# Patient Record
Sex: Male | Born: 1975 | Race: White | Hispanic: No | Marital: Married | Smoking: Current every day smoker
Health system: Southern US, Community
[De-identification: ages and names within clinical notes are randomized; demographics above are authoritative.]

## PROBLEM LIST (undated history)

## (undated) ENCOUNTER — Telehealth

## (undated) ENCOUNTER — Ambulatory Visit

## (undated) ENCOUNTER — Encounter: Attending: Adult Health | Primary: Adult Health

## (undated) ENCOUNTER — Encounter

## (undated) ENCOUNTER — Ambulatory Visit: Attending: Adult Health | Primary: Adult Health

## (undated) ENCOUNTER — Encounter: Payer: MEDICAID | Attending: Adult Health | Primary: Adult Health

## (undated) DIAGNOSIS — E1140 Type 2 diabetes mellitus with diabetic neuropathy, unspecified: Secondary | ICD-10-CM

## (undated) DIAGNOSIS — G8929 Other chronic pain: Secondary | ICD-10-CM

## (undated) DIAGNOSIS — E7800 Pure hypercholesterolemia, unspecified: Secondary | ICD-10-CM

## (undated) DIAGNOSIS — I10 Essential (primary) hypertension: Secondary | ICD-10-CM

## (undated) DIAGNOSIS — E119 Type 2 diabetes mellitus without complications: Secondary | ICD-10-CM

## (undated) DIAGNOSIS — I2510 Atherosclerotic heart disease of native coronary artery without angina pectoris: Secondary | ICD-10-CM

---

## 2009-02-07 ENCOUNTER — Ambulatory Visit: Payer: Self-pay | Admitting: Internal Medicine

## 2010-09-08 ENCOUNTER — Emergency Department: Payer: Self-pay | Admitting: Emergency Medicine

## 2010-11-08 ENCOUNTER — Ambulatory Visit: Payer: Self-pay | Admitting: Gastroenterology

## 2011-01-29 ENCOUNTER — Ambulatory Visit: Payer: Self-pay | Admitting: Internal Medicine

## 2011-04-13 ENCOUNTER — Inpatient Hospital Stay: Payer: Self-pay | Admitting: Specialist

## 2011-07-14 ENCOUNTER — Ambulatory Visit: Payer: Self-pay

## 2012-01-31 ENCOUNTER — Emergency Department: Payer: Self-pay | Admitting: *Deleted

## 2012-02-17 ENCOUNTER — Ambulatory Visit: Payer: Self-pay | Admitting: Sports Medicine

## 2012-02-19 ENCOUNTER — Other Ambulatory Visit: Payer: Self-pay | Admitting: Cardiology

## 2012-02-19 LAB — CK: CK, Total: 99 U/L (ref 35–232)

## 2012-02-19 LAB — TROPONIN I: Troponin-I: <0.02 ng/mL

## 2012-07-19 ENCOUNTER — Emergency Department: Payer: Self-pay | Admitting: Emergency Medicine

## 2012-07-19 LAB — COMPREHENSIVE METABOLIC PANEL
Alkaline Phosphatase: 66 U/L (ref 50–136)
Anion Gap: 10 (ref 7–16)
Bilirubin,Total: 0.5 mg/dL (ref 0.2–1.0)
Calcium, Total: 9.1 mg/dL (ref 8.5–10.1)
Chloride: 102 mmol/L (ref 98–107)
Co2: 23 mmol/L (ref 21–32)
Creatinine: 0.65 mg/dL (ref 0.60–1.30)
EGFR (African American): >60
Glucose: 213 mg/dL — ABNORMAL HIGH (ref 65–99)
SGOT(AST): 26 U/L (ref 15–37)
SGPT (ALT): 55 U/L (ref 12–78)

## 2012-07-19 LAB — CK TOTAL AND CKMB (NOT AT ARMC)
CK, Total: 126 U/L (ref 35–232)
CK-MB: <0.5 ng/mL — ABNORMAL LOW (ref 0.5–3.6)

## 2012-07-19 LAB — CBC
HCT: 40.9 % (ref 40.0–52.0)
HGB: 14.2 g/dL (ref 13.0–18.0)
MCH: 30.8 pg (ref 26.0–34.0)
MCV: 89 fL (ref 80–100)
RBC: 4.62 10*6/uL (ref 4.40–5.90)
RDW: 13 % (ref 11.5–14.5)

## 2012-08-03 ENCOUNTER — Ambulatory Visit: Payer: Self-pay | Admitting: Cardiology

## 2012-08-04 ENCOUNTER — Ambulatory Visit: Payer: Self-pay | Admitting: Family Medicine

## 2012-08-08 IMAGING — CR DG SHOULDER 3+V*R*
1 series · 3 of 3 positions shown · non-contrast
Comparison: none

REASON FOR EXAM: R shoulder pain due to fall
COMMENTS:

PROCEDURE:     MDR - MDR SHOULDER RIGHT COMPLETE  - July 14, 2011  [DATE]
RESULT:     A images of the right shoulder demonstrate no definite fracture,
dislocation or radiopaque foreign body.

[Series 1: view not recorded · 0.17mm/px · 3 of 3 slices shown]
[im 1/3]
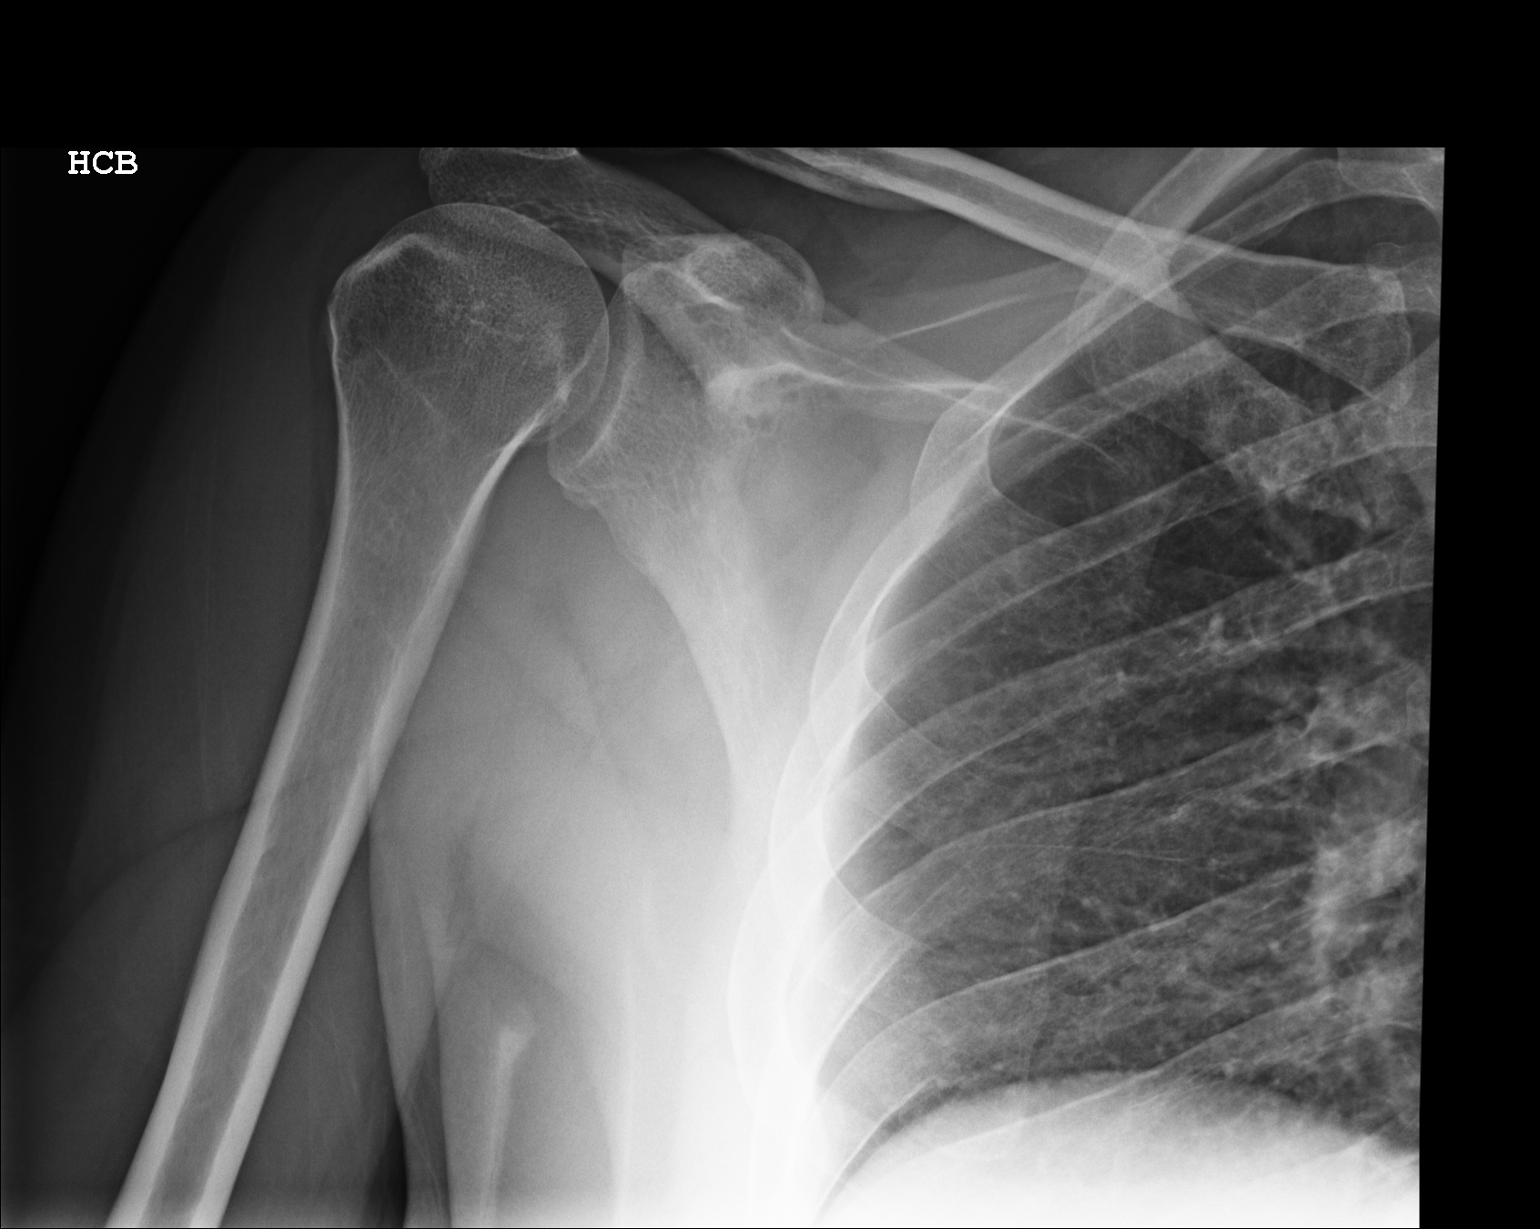
[im 2/3]
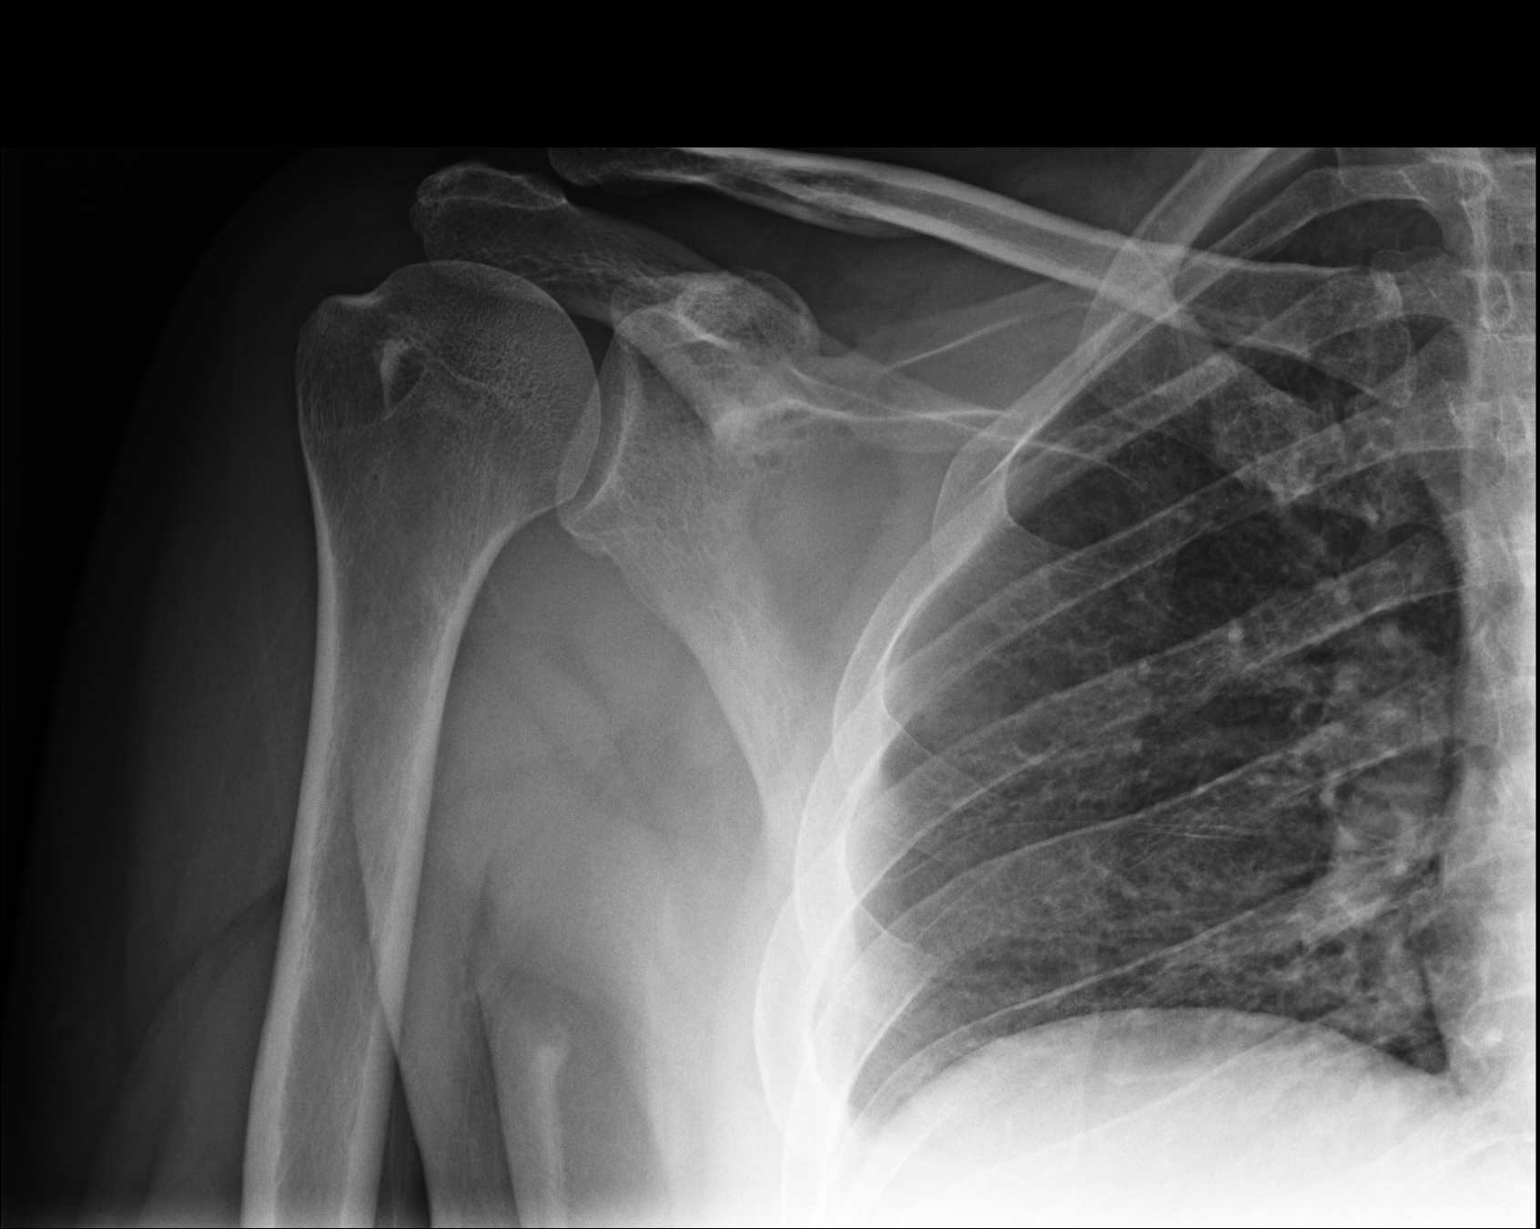
[im 3/3]
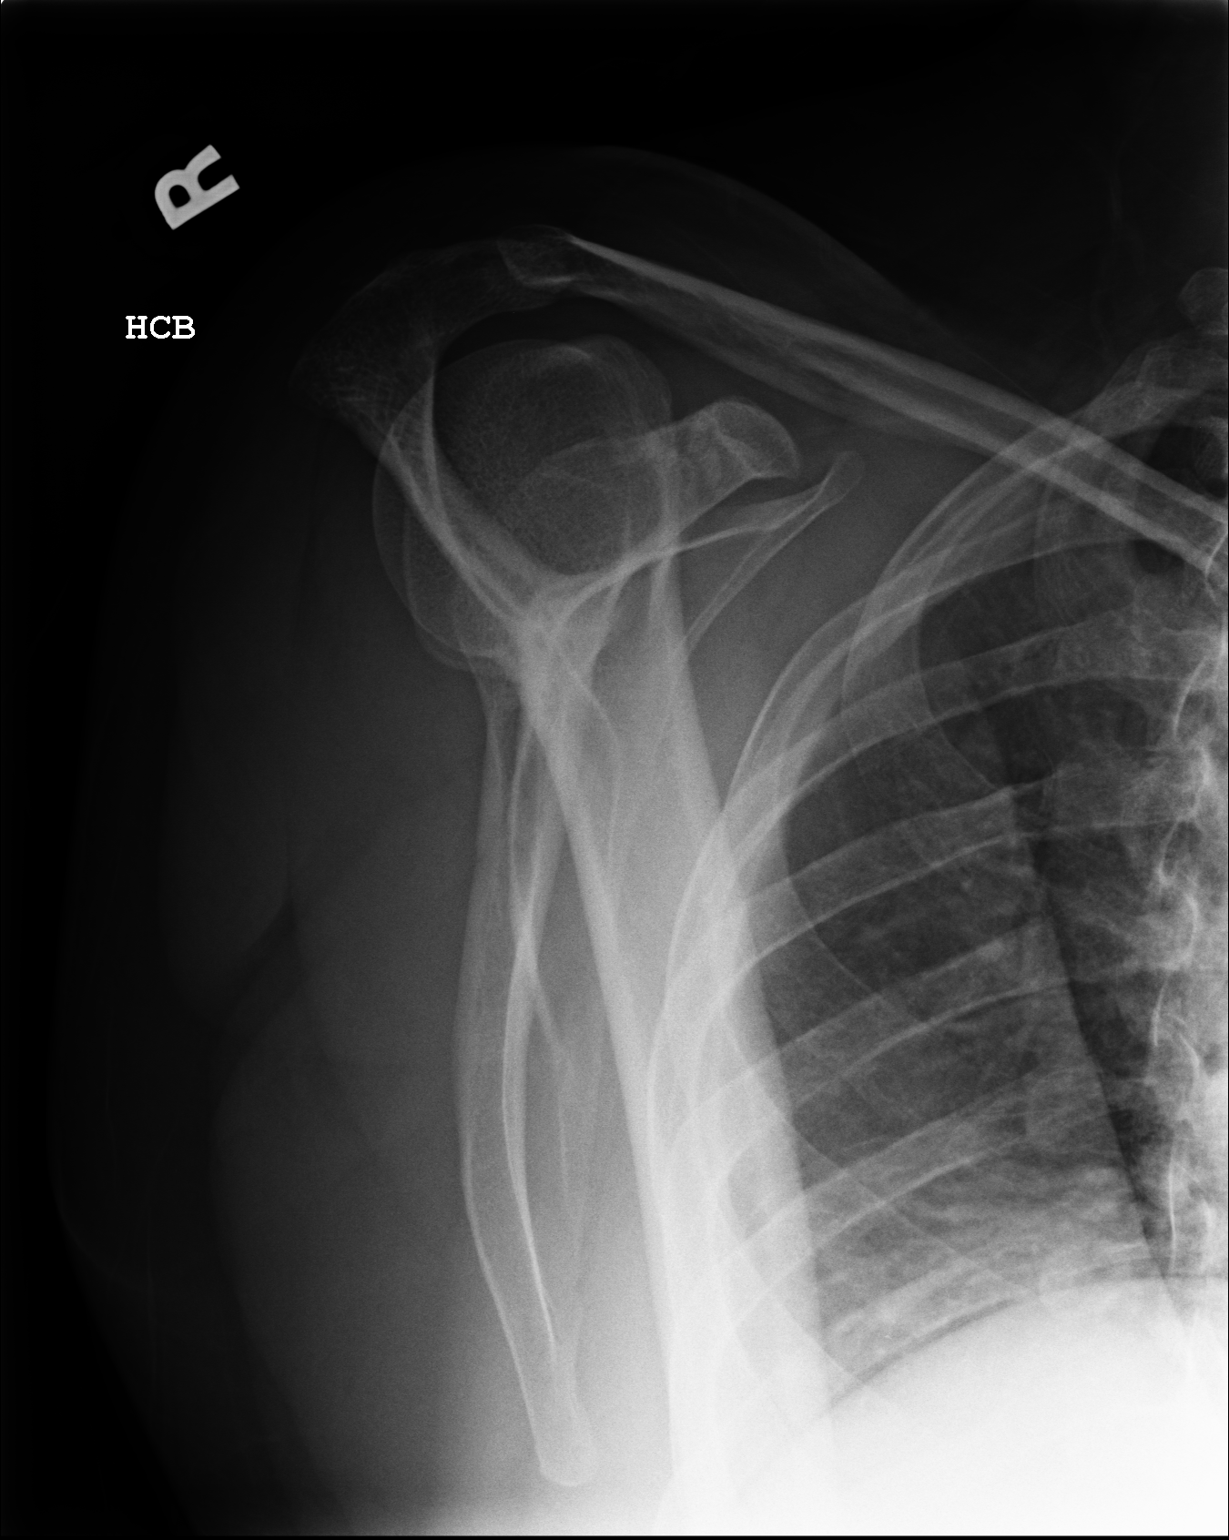

[3 of 3 positions shown; findings below may reference images not displayed]

IMPRESSION: Please see above.

## 2013-08-26 ENCOUNTER — Ambulatory Visit: Payer: Self-pay | Admitting: Family Medicine

## 2013-08-26 LAB — CREATININE, SERUM
EGFR (African American): >60
EGFR (Non-African Amer.): >60

## 2013-08-30 IMAGING — US ABDOMEN ULTRASOUND
1 series · 14 of 25 positions shown · non-contrast
Comparison: none

REASON FOR EXAM: epigastric pain  complete attn gallbladder pancreas
COMMENTS:

PROCEDURE:     [facility] - FEDH ABDOMEN GENERAL SURVEY  - August 04, 2012  [DATE]
RESULT:     Comparison: None
TECHNIQUE: Multiple gray-scale and color-flow Doppler images of the abdomen
are presented for review.

[Series 1: abdomen ultrasound · 0.31mm/px · 14 of 117 slices shown]
[im 1/117]
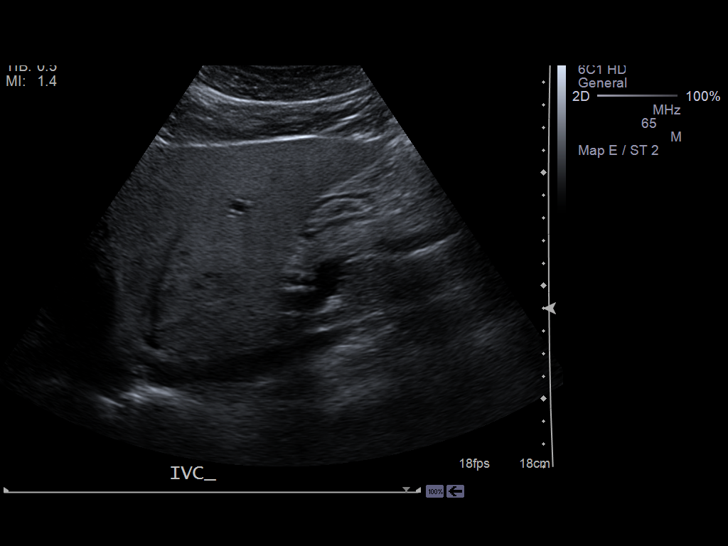
[im 10/117]
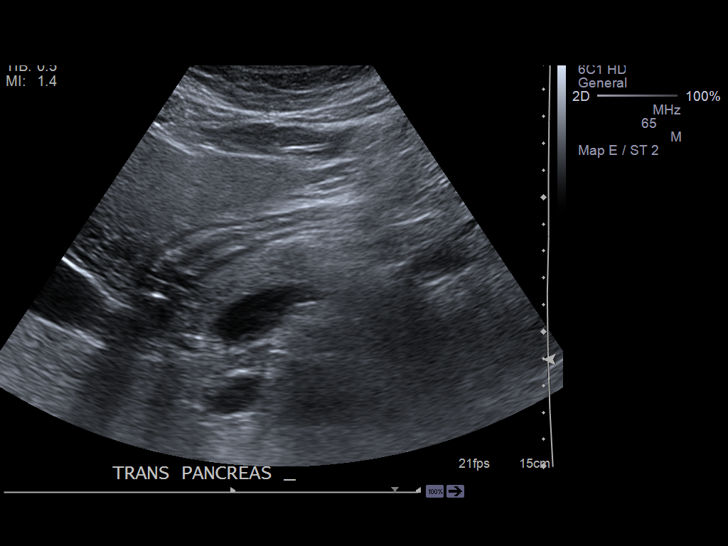
[im 20/117]
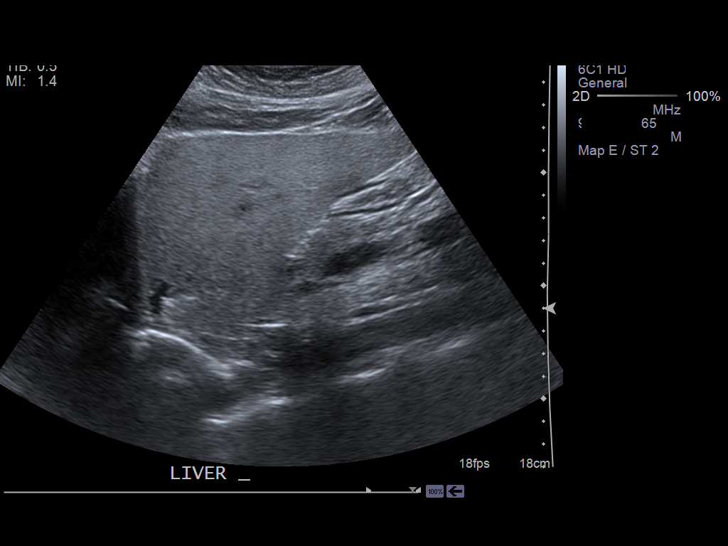
[im 30/117]
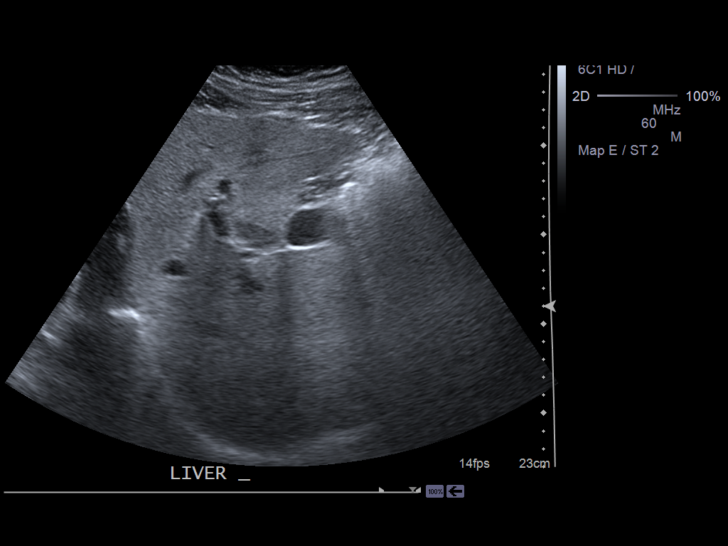
[im 39/117]
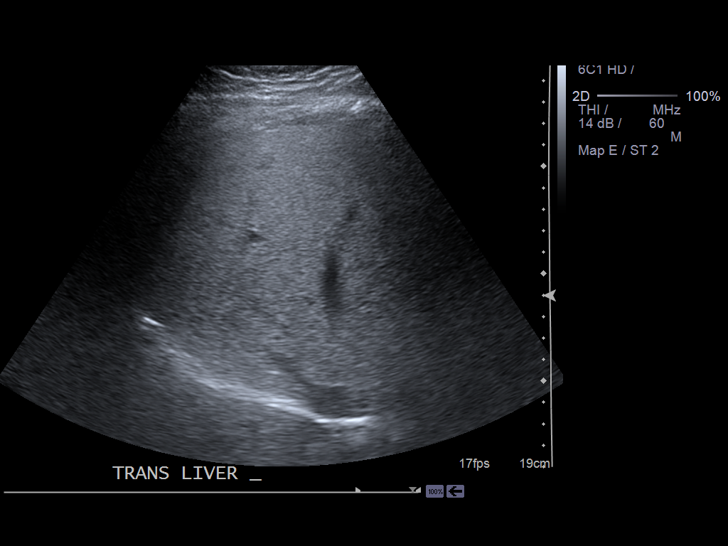
[im 44/117]
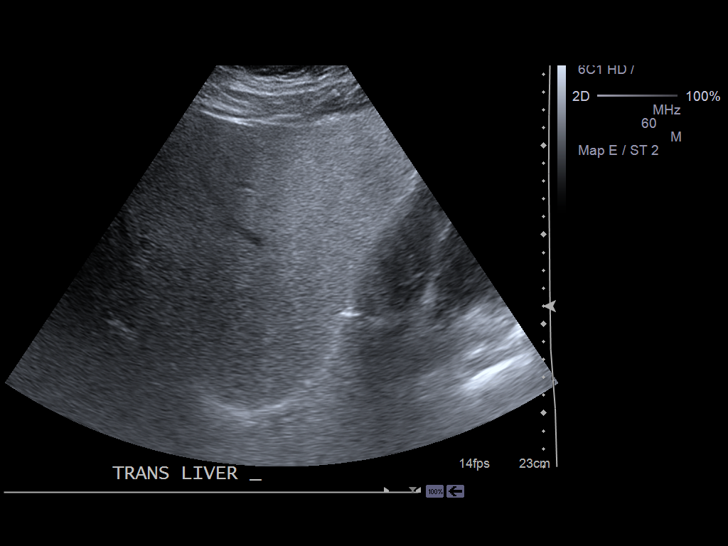
[im 54/117]
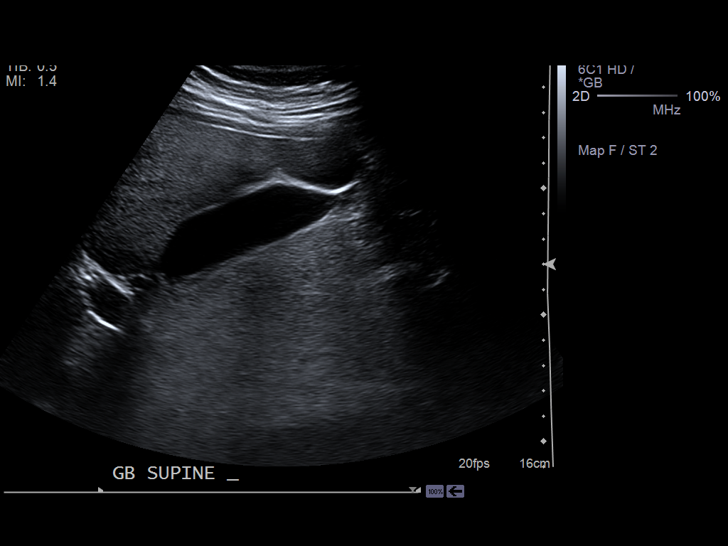
[im 63/117]
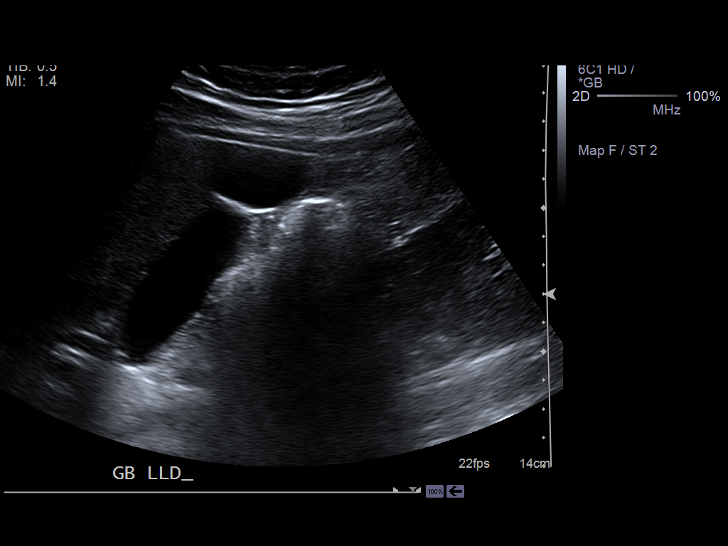
[im 73/117]
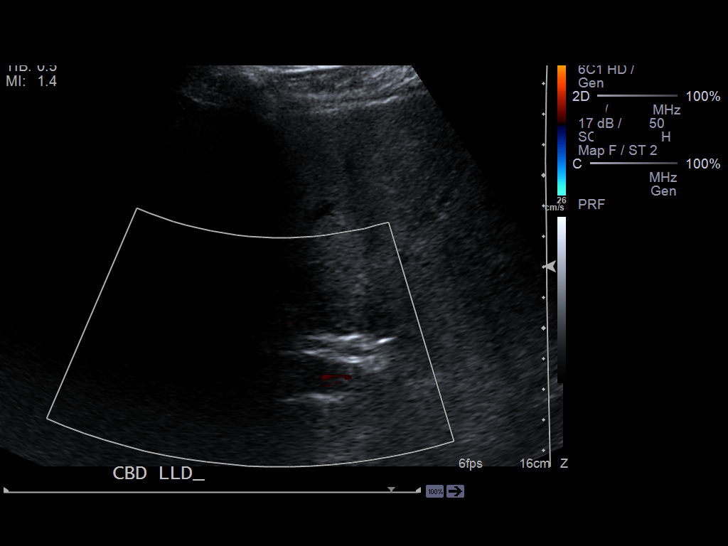
[im 78/117]
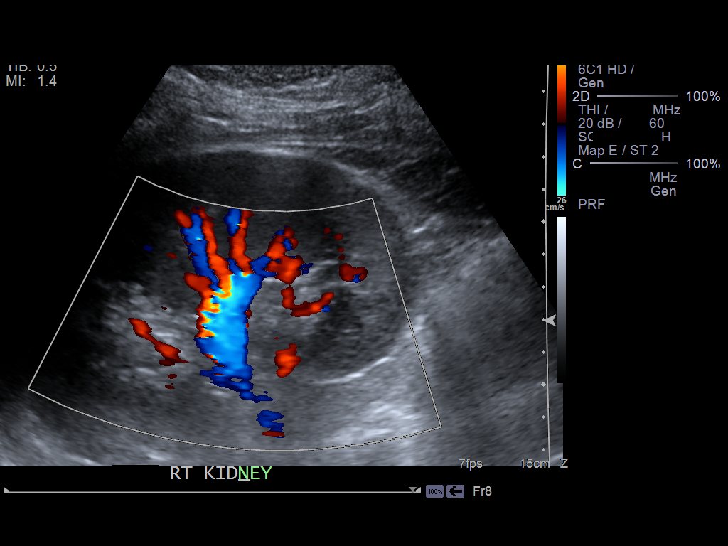
[im 88/117]
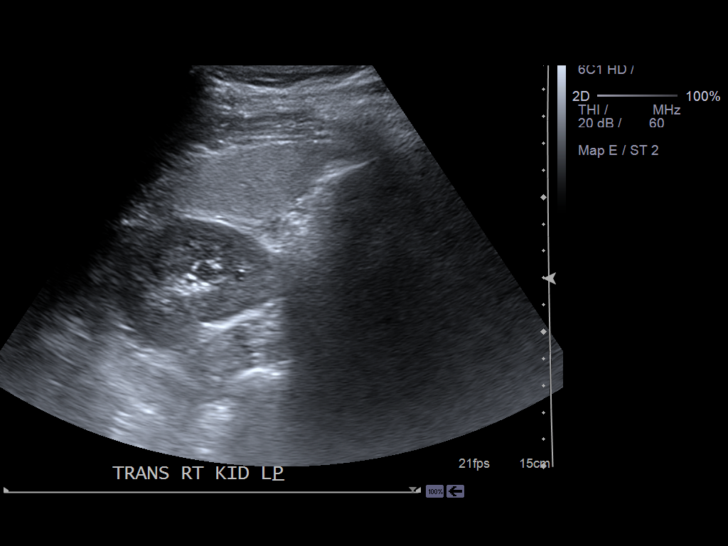
[im 97/117]
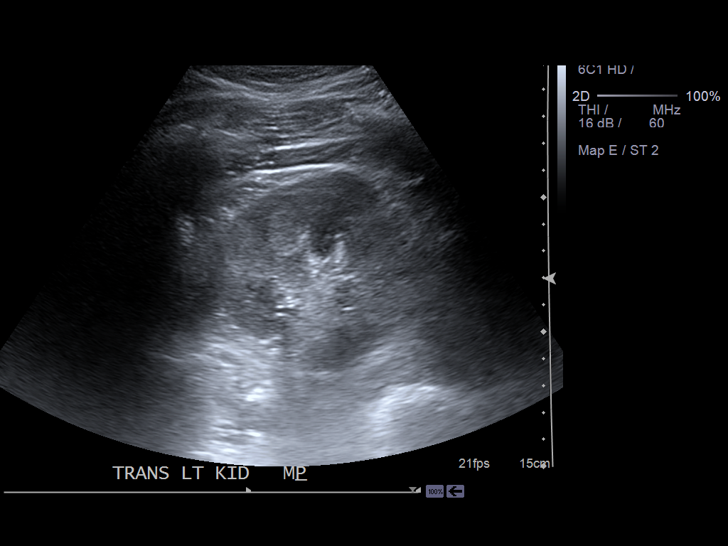
[im 107/117]
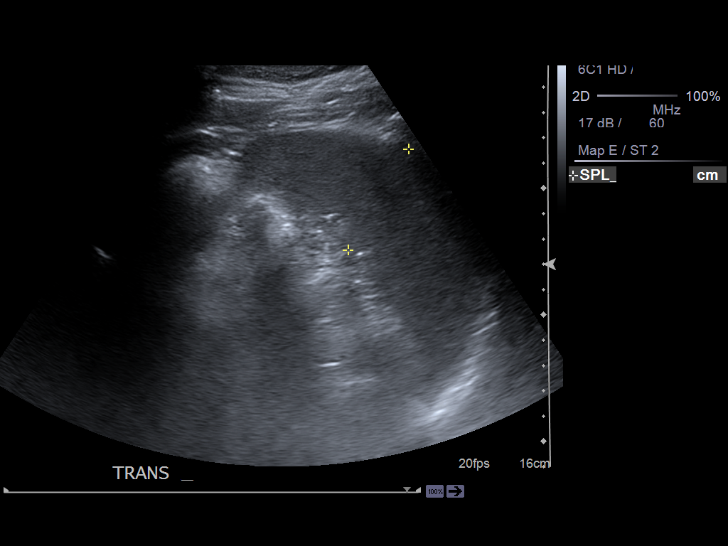
[im 117/117]
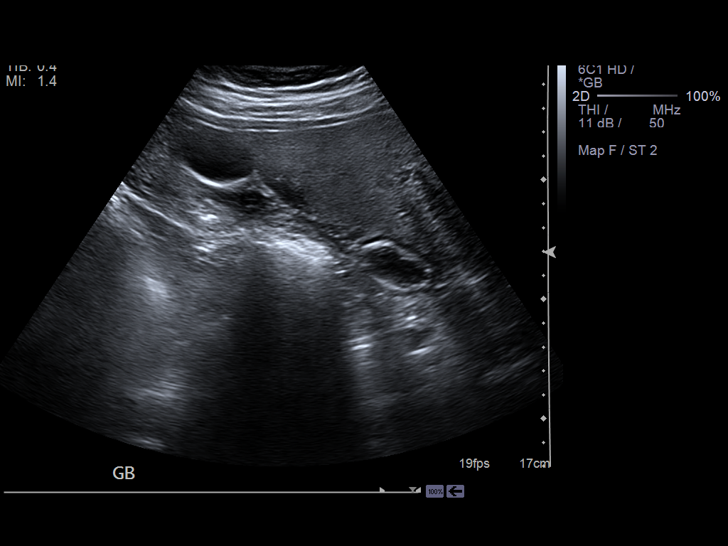

[14 of 25 positions shown; findings below may reference images not displayed]

FINDINGS: The liver is increased in echogenicity as can be seen with hepatic
steatosis. The liver is without evidence of a focal hepatic lesion.

There is no cholelithiasis or biliary sludge. There is no intra- or
extrahepatic biliary ductal dilatation. The common duct measures 4.4 mm in
maximal diameter. There is no gallbladder wall thickening, pericholecystic
fluid, or sonographic Murphy's sign.

The visualized portion of the pancreas is normal in echogenicity. The spleen
is top normal in size measuring 12.5 mm without focal abnormality. Bilateral
kidneys are normal in echogenicity and size. The right kidney measures
x 5.3 x 6 cm. The left kidney measures 12.9 x 6.4 x 7 cm. There are no renal
calculi or hydronephrosis. The abdominal aorta and IVC are unremarkable.
IMPRESSION: 1. Hepatic steatosis. Correlate with liver function tests.

[REDACTED]

## 2013-12-12 ENCOUNTER — Ambulatory Visit: Payer: Self-pay | Admitting: Neurology

## 2013-12-14 ENCOUNTER — Ambulatory Visit: Payer: Self-pay | Admitting: Neurology

## 2013-12-14 LAB — COMPREHENSIVE METABOLIC PANEL
ALK PHOS: 70 U/L
ANION GAP: 3 — AB (ref 7–16)
AST: 22 U/L (ref 15–37)
Albumin: 3.9 g/dL (ref 3.4–5.0)
BILIRUBIN TOTAL: 0.4 mg/dL (ref 0.2–1.0)
BUN: 10 mg/dL (ref 7–18)
CO2: 29 mmol/L (ref 21–32)
Calcium, Total: 8.7 mg/dL (ref 8.5–10.1)
Chloride: 99 mmol/L (ref 98–107)
Creatinine: 0.78 mg/dL (ref 0.60–1.30)
EGFR (African American): >60
EGFR (Non-African Amer.): >60
GLUCOSE: 253 mg/dL — AB (ref 65–99)
Osmolality: 270 (ref 275–301)
POTASSIUM: 4 mmol/L (ref 3.5–5.1)
SGPT (ALT): 65 U/L (ref 12–78)
SODIUM: 131 mmol/L — AB (ref 136–145)
Total Protein: 7.4 g/dL (ref 6.4–8.2)

## 2013-12-14 LAB — CSF CELL COUNT WITH DIFFERENTIAL
CSF Tube #: 1
Eosinophil: 0 %
Lymphocytes: 0 %
MONOCYTES/MACROPHAGES: 0 %
NEUTROS PCT: 0 %
Other Cells: 0 %
RBC (CSF): 97 /mm3
WBC (CSF): 0 /mm3

## 2013-12-14 LAB — CBC WITH DIFFERENTIAL/PLATELET
BASOS ABS: 0.1 10*3/uL (ref 0.0–0.1)
Basophil %: 1.2 %
EOS ABS: 0.1 10*3/uL (ref 0.0–0.7)
EOS PCT: 1 %
HCT: 40.2 % (ref 40.0–52.0)
HGB: 13.9 g/dL (ref 13.0–18.0)
LYMPHS ABS: 2.5 10*3/uL (ref 1.0–3.6)
Lymphocyte %: 22.7 %
MCH: 30.5 pg (ref 26.0–34.0)
MCHC: 34.6 g/dL (ref 32.0–36.0)
MCV: 88 fL (ref 80–100)
Monocyte #: 0.9 x10 3/mm (ref 0.2–1.0)
Monocyte %: 7.9 %
NEUTROS ABS: 7.3 10*3/uL — AB (ref 1.4–6.5)
Neutrophil %: 67.2 %
Platelet: 266 10*3/uL (ref 150–440)
RBC: 4.57 10*6/uL (ref 4.40–5.90)
RDW: 13.4 % (ref 11.5–14.5)
WBC: 10.9 10*3/uL — ABNORMAL HIGH (ref 3.8–10.6)

## 2013-12-14 LAB — PROTIME-INR
INR: 0.9
PROTHROMBIN TIME: 12.5 s (ref 11.5–14.7)

## 2013-12-14 LAB — PROTEIN, CSF: Protein, CSF: 45 mg/dL (ref 15–45)

## 2013-12-14 LAB — GLUCOSE, CSF: Glucose, CSF: 131 mg/dL — ABNORMAL HIGH (ref 40–75)

## 2014-01-11 DIAGNOSIS — Z0289 Encounter for other administrative examinations: Secondary | ICD-10-CM

## 2014-08-22 ENCOUNTER — Ambulatory Visit: Payer: Self-pay | Admitting: Neurology

## 2014-09-13 ENCOUNTER — Ambulatory Visit: Payer: Self-pay | Admitting: Neurology

## 2015-04-14 ENCOUNTER — Encounter: Payer: Self-pay | Admitting: *Deleted

## 2015-04-14 ENCOUNTER — Emergency Department: Payer: BLUE CROSS/BLUE SHIELD

## 2015-04-14 ENCOUNTER — Emergency Department
Admission: EM | Admit: 2015-04-14 | Discharge: 2015-04-14 | Disposition: A | Discharge status: Home / Self Care | Payer: BLUE CROSS/BLUE SHIELD | Attending: Student | Admitting: Student

## 2015-04-14 ENCOUNTER — Other Ambulatory Visit: Payer: Self-pay

## 2015-04-14 DIAGNOSIS — Y939 Activity, unspecified: Secondary | ICD-10-CM | POA: Insufficient documentation

## 2015-04-14 DIAGNOSIS — Y929 Unspecified place or not applicable: Secondary | ICD-10-CM | POA: Diagnosis not present

## 2015-04-14 DIAGNOSIS — R251 Tremor, unspecified: Secondary | ICD-10-CM | POA: Insufficient documentation

## 2015-04-14 DIAGNOSIS — I10 Essential (primary) hypertension: Secondary | ICD-10-CM | POA: Insufficient documentation

## 2015-04-14 DIAGNOSIS — W57XXXA Bitten or stung by nonvenomous insect and other nonvenomous arthropods, initial encounter: Secondary | ICD-10-CM | POA: Insufficient documentation

## 2015-04-14 DIAGNOSIS — Y999 Unspecified external cause status: Secondary | ICD-10-CM | POA: Diagnosis not present

## 2015-04-14 DIAGNOSIS — R569 Unspecified convulsions: Secondary | ICD-10-CM | POA: Diagnosis not present

## 2015-04-14 DIAGNOSIS — Z720 Tobacco use: Secondary | ICD-10-CM | POA: Diagnosis not present

## 2015-04-14 DIAGNOSIS — T148 Other injury of unspecified body region: Secondary | ICD-10-CM | POA: Diagnosis not present

## 2015-04-14 HISTORY — DX: Type 2 diabetes mellitus with diabetic neuropathy, unspecified: E11.40

## 2015-04-14 HISTORY — DX: Pure hypercholesterolemia, unspecified: E78.00

## 2015-04-14 HISTORY — DX: Atherosclerotic heart disease of native coronary artery without angina pectoris: I25.10

## 2015-04-14 HISTORY — DX: Type 2 diabetes mellitus without complications: E11.9

## 2015-04-14 HISTORY — DX: Essential (primary) hypertension: I10

## 2015-04-14 HISTORY — DX: Other chronic pain: G89.29

## 2015-04-14 LAB — URINE DRUG SCREEN, QUALITATIVE (ARMC ONLY)
Amphetamines, Ur Screen: NOT DETECTED
Barbiturates, Ur Screen: NOT DETECTED
Benzodiazepine, Ur Scrn: POSITIVE — AB
COCAINE METABOLITE, UR ~~LOC~~: NOT DETECTED
Cannabinoid 50 Ng, Ur ~~LOC~~: NOT DETECTED
MDMA (Ecstasy)Ur Screen: NOT DETECTED
Methadone Scn, Ur: NOT DETECTED
OPIATE, UR SCREEN: NOT DETECTED
PHENCYCLIDINE (PCP) UR S: NOT DETECTED
Tricyclic, Ur Screen: NOT DETECTED

## 2015-04-14 LAB — SALICYLATE LEVEL: Salicylate Lvl: <4 mg/dL (ref 2.8–30.0)

## 2015-04-14 LAB — GLUCOSE, CAPILLARY: GLUCOSE-CAPILLARY: 100 mg/dL — AB (ref 65–99)

## 2015-04-14 LAB — URINALYSIS COMPLETE WITH MICROSCOPIC (ARMC ONLY)
BACTERIA UA: NONE SEEN
Bilirubin Urine: NEGATIVE
Hgb urine dipstick: NEGATIVE
Ketones, ur: NEGATIVE mg/dL
Leukocytes, UA: NEGATIVE
Nitrite: NEGATIVE
Protein, ur: NEGATIVE mg/dL
Specific Gravity, Urine: 1.006 (ref 1.005–1.030)
WBC UA: NONE SEEN WBC/hpf (ref 0–5)
pH: 7 (ref 5.0–8.0)

## 2015-04-14 LAB — COMPREHENSIVE METABOLIC PANEL
ALT: 31 U/L (ref 17–63)
ANION GAP: 11 (ref 5–15)
AST: 25 U/L (ref 15–41)
Albumin: 5 g/dL (ref 3.5–5.0)
Alkaline Phosphatase: 65 U/L (ref 38–126)
BUN: 11 mg/dL (ref 6–20)
CO2: 26 mmol/L (ref 22–32)
Calcium: 9.8 mg/dL (ref 8.9–10.3)
Chloride: 99 mmol/L — ABNORMAL LOW (ref 101–111)
Creatinine, Ser: 0.68 mg/dL (ref 0.61–1.24)
GFR calc Af Amer: >60 mL/min (ref >60)
GFR calc non Af Amer: >60 mL/min (ref >60)
Glucose, Bld: 101 mg/dL — ABNORMAL HIGH (ref 65–99)
Potassium: 4.3 mmol/L (ref 3.5–5.1)
Sodium: 136 mmol/L (ref 135–145)
TOTAL PROTEIN: 8.5 g/dL — AB (ref 6.5–8.1)
Total Bilirubin: 0.4 mg/dL (ref 0.3–1.2)

## 2015-04-14 LAB — CBC WITH DIFFERENTIAL/PLATELET
Basophils Absolute: 0.1 10*3/uL (ref 0–0.1)
Basophils Relative: 1 %
EOS ABS: 0.1 10*3/uL (ref 0–0.7)
EOS PCT: 1 %
HCT: 48.4 % (ref 40.0–52.0)
HEMOGLOBIN: 16.6 g/dL (ref 13.0–18.0)
LYMPHS ABS: 3 10*3/uL (ref 1.0–3.6)
Lymphocytes Relative: 21 %
MCH: 30.4 pg (ref 26.0–34.0)
MCHC: 34.4 g/dL (ref 32.0–36.0)
MCV: 88.5 fL (ref 80.0–100.0)
Monocytes Absolute: 0.8 10*3/uL (ref 0.2–1.0)
Monocytes Relative: 6 %
Neutro Abs: 10.2 10*3/uL — ABNORMAL HIGH (ref 1.4–6.5)
Neutrophils Relative %: 71 %
PLATELETS: 277 10*3/uL (ref 150–440)
RBC: 5.46 MIL/uL (ref 4.40–5.90)
RDW: 14.1 % (ref 11.5–14.5)
WBC: 14.2 10*3/uL — AB (ref 3.8–10.6)

## 2015-04-14 LAB — TROPONIN I: Troponin I: <0.03 ng/mL (ref <0.031)

## 2015-04-14 LAB — ACETAMINOPHEN LEVEL: Acetaminophen (Tylenol), Serum: <10 ug/mL — ABNORMAL LOW (ref 10–30)

## 2015-04-14 LAB — CK: CK TOTAL: 96 U/L (ref 49–397)

## 2015-04-14 LAB — ETHANOL: Alcohol, Ethyl (B): <5 mg/dL (ref <5)

## 2015-04-14 MED ORDER — SODIUM CHLORIDE 0.9 % IV SOLN
1000.0000 mL | Freq: Once | INTRAVENOUS | Status: AC
  Administered 2015-04-14: 1000 mL via INTRAVENOUS

## 2015-04-14 MED ORDER — SODIUM CHLORIDE 0.9 % IV SOLN
1000.0000 mg | Freq: Two times a day (BID) | INTRAVENOUS | Status: DC
  Administered 2015-04-14: 1000 mg via INTRAVENOUS
  Filled 2015-04-14 (×3): qty 10

## 2015-04-14 MED ORDER — DOXYCYCLINE HYCLATE 100 MG PO CAPS: 100.0000 mg | ORAL_CAPSULE | Freq: Two times a day (BID) | ORAL | Status: DC

## 2015-04-14 MED ORDER — ONDANSETRON HCL 4 MG/2ML IJ SOLN
4.0000 mg | Freq: Once | INTRAMUSCULAR | Status: AC
  Administered 2015-04-14: 4 mg via INTRAVENOUS

## 2015-04-14 MED ORDER — LORAZEPAM 2 MG/ML IJ SOLN
2.0000 mg | Freq: Once | INTRAMUSCULAR | Status: AC
  Administered 2015-04-14: 2 mg via INTRAVENOUS
  Filled 2015-04-14: qty 1

## 2015-04-14 MED ORDER — ONDANSETRON HCL 4 MG/2ML IJ SOLN
INTRAMUSCULAR | Status: AC
  Filled 2015-04-14: qty 2

## 2015-04-14 NOTE — ED Notes (Signed)
Pt began to shake back and forth in the bed arms and legs moving. Pt alert during episode and able to talk and make eye contact. Wife states the episodes are worse and similar to the ones he has been having at home for the past 2 weeks. Mayford Knife, MD brought to bedside new orders received.

## 2015-04-14 NOTE — ED Notes (Signed)
Pt did state that he was bit by a tick on his rt thumb. Pt states he was unsure how long it had been present.

## 2015-04-14 NOTE — ED Notes (Signed)
keppra ordered from pharmacy

## 2015-04-14 NOTE — ED Provider Notes (Signed)
[facility] Emergency Department Provider Note     Time seen: ----------------------------------------- 1:14 PM on 04/14/2015 -----------------------------------------    I have reviewed the triage vital signs and the nursing notes.   HISTORY  Chief Complaint Tremors    HPI Derek [name] is a 39 y.o. male who presents ER for tremor for the last at least a week and a half. Patient states he suffered for muscle tremors for 2 months. He states that since Saturday they're getting worse. He was seen by the [facility] was told he had possible serotonin syndrome. He was told to wean off one of his medications. Patient states she's taken 20 mg of Valium prior to arrival, has not changed his medications other than taking the extra Valium, has not run out of any medications. Patient has been alcohol free for the last 2 years. Smokes daily, denies illicit drug use.   Past Medical History  Diagnosis Date  . Diabetes mellitus without complication   . Chronic pain   . Coronary artery disease   . Diabetic neuropathy   . Hypertension   . Hypercholesteremia     There are no active problems to display for this patient.   No past surgical history on file.  Allergies Review of patient's allergies indicates no known allergies.  Social History History  Substance Use Topics  . Smoking status: Current Every Day Smoker  . Smokeless tobacco: Not on file  . Alcohol Use: No    Review of Systems Constitutional: Negative for fever. Eyes: Negative for visual changes. ENT: Negative for sore throat. Cardiovascular: Negative for chest pain. Respiratory: Negative for shortness of breath. Gastrointestinal: Negative for abdominal pain, vomiting and diarrhea. Genitourinary: Negative for dysuria. Musculoskeletal: Negative for back pain. Skin: Negative for rash. Neurological: Negative for headaches, positive for tremor  10-point ROS otherwise  negative.  ____________________________________________   PHYSICAL EXAM:  VITAL SIGNS: ED Triage Vitals  Enc Vitals Group     BP 04/14/15 1247 132/99 mmHg     Pulse Rate 04/14/15 1247 103     Resp 04/14/15 1247 22     Temp 04/14/15 1247 98.5 F (36.9 C)     Temp src --      SpO2 04/14/15 1247 98 %     Weight 04/14/15 1247 234 lb (106.142 kg)     Height 04/14/15 1247 6' (1.829 m)     Head Cir --      Peak Flow --      Pain Score 04/14/15 1250 6     Pain Loc --      Pain Edu? --      Excl. in GC? --     Constitutional: Alert and oriented. Well appearing and in no distress. Eyes: Conjunctivae are normal. PERRL. Normal extraocular movements. ENT   Head: Normocephalic and atraumatic.   Nose: No congestion/rhinnorhea.   Mouth/Throat: Mucous membranes are moist.   Neck: No stridor. Cardiovascular: Normal rate, regular rhythm. Normal and symmetric distal pulses are present in all extremities. No murmurs, rubs, or gallops. Respiratory: Normal respiratory effort without tachypnea nor retractions. Breath sounds are clear and equal bilaterally. No wheezes/rales/rhonchi. Gastrointestinal: Soft and nontender. No distention. No abdominal bruits.  Musculoskeletal: Nontender with normal range of motion in all extremities. No joint effusions.  No lower extremity tenderness nor edema. Neurologic:  Normal speech and language. No gross focal neurologic deficits are appreciated. Patient with tremor, no sensory deficit, shaking episodes. Skin:  Skin is warm, dry and  intact. No rash noted. Psychiatric: Mood and affect are normal. Speech and behavior are normal. Patient exhibits appropriate insight and judgment. ____________________________________________  ED COURSE:  Pertinent labs & imaging results that were available during my care of the patient were reviewed by me and considered in my medical decision making (see chart for details). Patient with nonspecific shaking, received IV  Ativan will check basic labs, toxin screen, head CT. Also possible tickborne etiology. ____________________________________________    LABS (pertinent positives/negatives)  Labs Reviewed  CBC WITH DIFFERENTIAL/PLATELET - Abnormal; Notable for the following:    WBC 14.2 (*)    Neutro Abs 10.2 (*)    All other components within normal limits  COMPREHENSIVE METABOLIC PANEL - Abnormal; Notable for the following:    Chloride 99 (*)    Glucose, Bld 101 (*)    Total Protein 8.5 (*)    All other components within normal limits  URINALYSIS COMPLETEWITH MICROSCOPIC ([facility] ONLY) - Abnormal; Notable for the following:    Color, Urine STRAW (*)    APPearance CLEAR (*)    Glucose, UA >500 (*)    Squamous Epithelial / LPF 0-5 (*)    All other components within normal limits  URINE DRUG SCREEN, QUALITATIVE ([facility] ONLY) - Abnormal; Notable for the following:    Benzodiazepine, Ur Scrn POSITIVE (*)    All other components within normal limits  ACETAMINOPHEN LEVEL - Abnormal; Notable for the following:    Acetaminophen (Tylenol), Serum <10 (*)    All other components within normal limits  GLUCOSE, CAPILLARY - Abnormal; Notable for the following:    Glucose-Capillary 100 (*)    All other components within normal limits  TROPONIN I  CK  ETHANOL  SALICYLATE LEVEL  LYME DISEASE DNA BY PCR([facility]ORRELIA BURG)  ROCKY MTN SPOTTED FVR ABS PNL(IGG+IGM)  LYME DISEASE DNA BY PCR(BORRELIA BURG)  CBG MONITORING, ED    RADIOLOGY Images were viewed by me  CT head is unremarkable  ____________________________________________  FINAL ASSESSMENT AND PLAN  Tremor, non-epileptic seizure, tick bite  Plan: Patient with labs and imaging as dictated above. Patient with a shaking event while in the ER, received IV Ativan which seemed to improve his symptoms. Also being given IV Keppra. Afterwards patient felt markedly improved. Discussed with the neurologist on-call felt this may be pseudoseizure. His exam is  fairly unremarkable, did not feel comfortable prescribing her medications other than doxycycline. Lyme and Rocky my spotted fever labs were sent off. He'll be contacted if they're positive.   Emily Filbert, MD   Emily Filbert, MD 04/15/15 [id number]

## 2015-04-14 NOTE — ED Notes (Signed)
Pt was drowsy for about after ativan. More alert at this at this time. Able to carry on conversation. Pt to CT at this time.

## 2015-04-14 NOTE — ED Notes (Signed)
Pt began to rock back and forth in bed arms and legs where shaking. Pt

## 2015-04-14 NOTE — ED Notes (Signed)
Pt states he has suffered from these muscle tremors x 2 months. Pt states since Saturday they are getting worse. Pt was seen by [facility]. Pt was told he had possible serotonin syndrome. Pt was told to wean off one of his meds.

## 2015-04-14 NOTE — Discharge Instructions (Signed)

## 2015-04-16 LAB — ROCKY MTN SPOTTED FVR ABS PNL(IGG+IGM)
RMSF IgG: NEGATIVE
RMSF IgM: 0.26 index (ref 0.00–0.89)

## 2015-04-16 LAB — LYME DISEASE DNA BY PCR(BORRELIA BURG)

## 2015-04-20 LAB — LYME DISEASE DNA BY PCR(BORRELIA BURG): Lyme Disease(B.burgdorferi)PCR: NEGATIVE

## 2015-08-18 ENCOUNTER — Emergency Department
Admission: EM | Admit: 2015-08-18 | Discharge: 2015-08-20 | Disposition: A | Discharge status: Other Acute Inpatient Hospital | Payer: BLUE CROSS/BLUE SHIELD | Attending: Emergency Medicine | Admitting: Emergency Medicine

## 2015-08-18 ENCOUNTER — Encounter: Payer: Self-pay | Admitting: Emergency Medicine

## 2015-08-18 DIAGNOSIS — I10 Essential (primary) hypertension: Secondary | ICD-10-CM | POA: Insufficient documentation

## 2015-08-18 DIAGNOSIS — F1310 Sedative, hypnotic or anxiolytic abuse, uncomplicated: Secondary | ICD-10-CM | POA: Insufficient documentation

## 2015-08-18 DIAGNOSIS — Z7982 Long term (current) use of aspirin: Secondary | ICD-10-CM | POA: Insufficient documentation

## 2015-08-18 DIAGNOSIS — F17200 Nicotine dependence, unspecified, uncomplicated: Secondary | ICD-10-CM | POA: Diagnosis not present

## 2015-08-18 DIAGNOSIS — Z79899 Other long term (current) drug therapy: Secondary | ICD-10-CM | POA: Diagnosis not present

## 2015-08-18 DIAGNOSIS — F419 Anxiety disorder, unspecified: Secondary | ICD-10-CM | POA: Insufficient documentation

## 2015-08-18 DIAGNOSIS — E1140 Type 2 diabetes mellitus with diabetic neuropathy, unspecified: Secondary | ICD-10-CM | POA: Insufficient documentation

## 2015-08-18 DIAGNOSIS — R451 Restlessness and agitation: Secondary | ICD-10-CM | POA: Diagnosis not present

## 2015-08-18 DIAGNOSIS — R45851 Suicidal ideations: Secondary | ICD-10-CM

## 2015-08-18 DIAGNOSIS — F1210 Cannabis abuse, uncomplicated: Secondary | ICD-10-CM | POA: Insufficient documentation

## 2015-08-18 DIAGNOSIS — Z792 Long term (current) use of antibiotics: Secondary | ICD-10-CM | POA: Diagnosis not present

## 2015-08-18 LAB — CBC WITH DIFFERENTIAL/PLATELET
BASOS PCT: 2 %
Basophils Absolute: 0.3 10*3/uL — ABNORMAL HIGH (ref 0–0.1)
EOS ABS: 0.1 10*3/uL (ref 0–0.7)
EOS PCT: 1 %
HCT: 46.8 % (ref 40.0–52.0)
Hemoglobin: 15.9 g/dL (ref 13.0–18.0)
Lymphocytes Relative: 16 %
Lymphs Abs: 3 10*3/uL (ref 1.0–3.6)
MCH: 30.6 pg (ref 26.0–34.0)
MCHC: 34 g/dL (ref 32.0–36.0)
MCV: 89.9 fL (ref 80.0–100.0)
MONO ABS: 0.8 10*3/uL (ref 0.2–1.0)
MONOS PCT: 4 %
NEUTROS PCT: 77 %
Neutro Abs: 14.6 10*3/uL — ABNORMAL HIGH (ref 1.4–6.5)
PLATELETS: 403 10*3/uL (ref 150–440)
RBC: 5.2 MIL/uL (ref 4.40–5.90)
RDW: 13.4 % (ref 11.5–14.5)
WBC: 18.8 10*3/uL — ABNORMAL HIGH (ref 3.8–10.6)

## 2015-08-18 LAB — URINE DRUG SCREEN, QUALITATIVE (ARMC ONLY)
AMPHETAMINES, UR SCREEN: NOT DETECTED
BARBITURATES, UR SCREEN: NOT DETECTED
BENZODIAZEPINE, UR SCRN: POSITIVE — AB
COCAINE METABOLITE, UR ~~LOC~~: NOT DETECTED
Cannabinoid 50 Ng, Ur ~~LOC~~: POSITIVE — AB
MDMA (ECSTASY) UR SCREEN: NOT DETECTED
METHADONE SCREEN, URINE: NOT DETECTED
OPIATE, UR SCREEN: NOT DETECTED
PHENCYCLIDINE (PCP) UR S: NOT DETECTED
TRICYCLIC, UR SCREEN: NOT DETECTED

## 2015-08-18 LAB — URINALYSIS COMPLETE WITH MICROSCOPIC (ARMC ONLY)
BACTERIA UA: NONE SEEN
Bilirubin Urine: NEGATIVE
Hgb urine dipstick: NEGATIVE
Ketones, ur: NEGATIVE mg/dL
Leukocytes, UA: NEGATIVE
Nitrite: NEGATIVE
PROTEIN: NEGATIVE mg/dL
RBC / HPF: NONE SEEN RBC/hpf (ref 0–5)
SQUAMOUS EPITHELIAL / LPF: NONE SEEN
Specific Gravity, Urine: 1.002 — ABNORMAL LOW (ref 1.005–1.030)
WBC UA: NONE SEEN WBC/hpf (ref 0–5)
pH: 6 (ref 5.0–8.0)

## 2015-08-18 LAB — COMPREHENSIVE METABOLIC PANEL
ALBUMIN: 4.7 g/dL (ref 3.5–5.0)
ALK PHOS: 74 U/L (ref 38–126)
ALT: 29 U/L (ref 17–63)
AST: 23 U/L (ref 15–41)
Anion gap: 11 (ref 5–15)
BUN: 8 mg/dL (ref 6–20)
CALCIUM: 9.8 mg/dL (ref 8.9–10.3)
CHLORIDE: 99 mmol/L — AB (ref 101–111)
CO2: 26 mmol/L (ref 22–32)
CREATININE: 0.7 mg/dL (ref 0.61–1.24)
GFR calc non Af Amer: >60 mL/min (ref >60)
GLUCOSE: 147 mg/dL — AB (ref 65–99)
Potassium: 3.9 mmol/L (ref 3.5–5.1)
SODIUM: 136 mmol/L (ref 135–145)
Total Bilirubin: 0.6 mg/dL (ref 0.3–1.2)
Total Protein: 8.8 g/dL — ABNORMAL HIGH (ref 6.5–8.1)

## 2015-08-18 LAB — ETHANOL: Alcohol, Ethyl (B): <5 mg/dL (ref <5)

## 2015-08-18 MED ORDER — ISOSORBIDE MONONITRATE ER 60 MG PO TB24
60.0000 mg | ORAL_TABLET | Freq: Every day | ORAL | Status: DC
Start: 2015-08-19 — End: 2015-08-20
  Administered 2015-08-19 – 2015-08-20 (×2): 60 mg via ORAL
  Filled 2015-08-18 (×2): qty 1

## 2015-08-18 MED ORDER — OXYCODONE HCL 5 MG PO TABS
30.0000 mg | ORAL_TABLET | Freq: Four times a day (QID) | ORAL | Status: DC
  Administered 2015-08-19 – 2015-08-20 (×6): 30 mg via ORAL
  Filled 2015-08-18 (×6): qty 6

## 2015-08-18 MED ORDER — FAMOTIDINE 20 MG PO TABS
20.0000 mg | ORAL_TABLET | Freq: Two times a day (BID) | ORAL | Status: DC
  Administered 2015-08-19: 20 mg via ORAL
  Filled 2015-08-18: qty 1

## 2015-08-18 MED ORDER — LOSARTAN POTASSIUM 25 MG PO TABS
50.0000 mg | ORAL_TABLET | Freq: Every day | ORAL | Status: DC
  Administered 2015-08-19 – 2015-08-20 (×2): 50 mg via ORAL
  Filled 2015-08-18 (×2): qty 2

## 2015-08-18 MED ORDER — LORAZEPAM 2 MG/ML IJ SOLN
2.0000 mg | Freq: Once | INTRAMUSCULAR | Status: AC
  Administered 2015-08-18: 2 mg via INTRAMUSCULAR
  Filled 2015-08-18: qty 1

## 2015-08-18 MED ORDER — ROSUVASTATIN CALCIUM 20 MG PO TABS
20.0000 mg | ORAL_TABLET | Freq: Every day | ORAL | Status: DC
  Administered 2015-08-19: 20 mg via ORAL
  Filled 2015-08-18 (×2): qty 1

## 2015-08-18 MED ORDER — DULOXETINE HCL 20 MG PO CPEP
20.0000 mg | ORAL_CAPSULE | Freq: Two times a day (BID) | ORAL | Status: DC
  Administered 2015-08-19 (×2): 20 mg via ORAL
  Filled 2015-08-18 (×3): qty 1

## 2015-08-18 MED ORDER — VERAPAMIL HCL ER 180 MG PO TBCR
360.0000 mg | EXTENDED_RELEASE_TABLET | Freq: Every day | ORAL | Status: DC
  Administered 2015-08-19 – 2015-08-20 (×2): 360 mg via ORAL
  Filled 2015-08-18 (×2): qty 2

## 2015-08-18 MED ORDER — CLOPIDOGREL BISULFATE 75 MG PO TABS
75.0000 mg | ORAL_TABLET | Freq: Every day | ORAL | Status: DC
  Administered 2015-08-19 – 2015-08-20 (×2): 75 mg via ORAL
  Filled 2015-08-18 (×2): qty 1

## 2015-08-18 MED ORDER — DIAZEPAM 5 MG PO TABS
10.0000 mg | ORAL_TABLET | Freq: Four times a day (QID) | ORAL | Status: DC
  Administered 2015-08-19 – 2015-08-20 (×6): 10 mg via ORAL
  Filled 2015-08-18 (×6): qty 2

## 2015-08-18 MED ORDER — ASPIRIN EC 81 MG PO TBEC
81.0000 mg | DELAYED_RELEASE_TABLET | Freq: Every day | ORAL | Status: DC
  Administered 2015-08-19 – 2015-08-20 (×2): 81 mg via ORAL
  Filled 2015-08-18 (×2): qty 1

## 2015-08-18 MED ORDER — CANAGLIFLOZIN 300 MG PO TABS
150.0000 mg | ORAL_TABLET | Freq: Two times a day (BID) | ORAL | Status: DC
  Administered 2015-08-19 – 2015-08-20 (×3): 150 mg via ORAL
  Filled 2015-08-18 (×5): qty 150

## 2015-08-18 MED ORDER — INSULIN GLARGINE 100 UNIT/ML ~~LOC~~ SOLN: 20.0000 [IU] | Freq: Once | SUBCUTANEOUS | Status: DC

## 2015-08-18 MED ORDER — METFORMIN HCL 500 MG PO TABS
1000.0000 mg | ORAL_TABLET | Freq: Two times a day (BID) | ORAL | Status: DC
  Administered 2015-08-19 – 2015-08-20 (×3): 1000 mg via ORAL
  Filled 2015-08-18 (×3): qty 2

## 2015-08-18 MED ORDER — IBUPROFEN 600 MG PO TABS
600.0000 mg | ORAL_TABLET | Freq: Four times a day (QID) | ORAL | Status: DC | PRN
  Administered 2015-08-18 – 2015-08-19 (×2): 600 mg via ORAL
  Filled 2015-08-18 (×2): qty 1

## 2015-08-18 MED ORDER — LORAZEPAM 2 MG PO TABS
2.0000 mg | ORAL_TABLET | Freq: Once | ORAL | Status: AC
  Administered 2015-08-18: 2 mg via ORAL
  Filled 2015-08-18: qty 1

## 2015-08-18 MED ORDER — GABAPENTIN 600 MG PO TABS
600.0000 mg | ORAL_TABLET | Freq: Two times a day (BID) | ORAL | Status: DC
  Administered 2015-08-19 – 2015-08-20 (×4): 600 mg via ORAL
  Filled 2015-08-18 (×4): qty 1

## 2015-08-18 NOTE — ED Notes (Addendum)
Brought in by [place] pd.  Reports pt held a gun to his head today, reported by his wife.  Pt anxious but cooperative on arrive.  Arrived in cuffs, removed in triage. Pt reports that he is stopping his cymbalta and it seems to be making depression. worse. .Marland Kitchen

## 2015-08-18 NOTE — ED Notes (Signed)
BEHAVIORAL HEALTH ROUNDING Patient sleeping: Yes.   Patient alert and oriented: yes Behavior appropriate: Yes.  ; If no, describe:  Nutrition and fluids offered: Yes  Toileting and hygiene offered: Yes  Sitter present: not applicable Law enforcement present: Yes  

## 2015-08-18 NOTE — ED Notes (Signed)
Friend that brought patient in took all of patient belongings including cell phone.

## 2015-08-18 NOTE — ED Notes (Signed)
Patient has been seen by ERMD, agree does not need sitter and is not suicide risk at this time.

## 2015-08-18 NOTE — ED Notes (Signed)
Dr Huel CoteQuigley asking to speak to the friend who came with pt to the ER. Friend is not in the lobby. Asked pt if he knew friends phone number. Pt states it is ok to call his wife now to get information to contact his friend. Pt also states he gets his medications filled at [facility]. Pharmacy tech informed.

## 2015-08-18 NOTE — ED Notes (Signed)
Patient states he does not know his meds, that his wife dispenses them to him.

## 2015-08-18 NOTE — ED Notes (Signed)

## 2015-08-18 NOTE — ED Provider Notes (Signed)
Time Seen: Approximately 1950  I have reviewed the triage notes  Chief Complaint: Suicidal   History of Present Illness: Derek [name] is a 39 y.o. male who was brought here via local police for involuntary commitment evaluation. The involuntary commitment paperwork was filled out by the Police Department after they had difficulty getting the patient to voluntarily come out of the household. Patient states that he currently has no suicidal ideation and that he was having an argument with his wife and put a gun to his chin. He states he had no intention of harming himself he just wanted to "" stop the argument "". The wife has a different version and states that the patient has had post on Facebook and letters at home they have indicated that he has had suicidal ideation now for at least the past week. Patient states that his friend is removed all the guns from the household and that he is currently very anxious because he is not located at his household. The patient denies any current homicidal thoughts or hallucinations. The conversation with the wife shows conflicting stories on whether the patient is having suicidal ideation.   Past Medical History  Diagnosis Date  . Diabetes mellitus without complication (HCC)   . Chronic pain   . Coronary artery disease   . Diabetic neuropathy (HCC)   . Hypertension   . Hypercholesteremia     There are no active problems to display for this patient.   History reviewed. No pertinent past surgical history.  History reviewed. No pertinent past surgical history.  Current Outpatient Rx  Name  Route  Sig  Dispense  Refill  . aspirin (ASPIR-81) 81 MG EC tablet   Oral   Take 81 mg by mouth daily.         . clopidogrel (PLAVIX) 75 MG tablet   Oral   Take 75 mg by mouth daily.      3   . CRESTOR 20 MG tablet   Oral   Take 20 mg by mouth daily.      3     Dispense as written.   . diazepam (VALIUM) 10 MG tablet   Oral   Take 10  mg by mouth 4 (four) times daily.      0   . DULoxetine (CYMBALTA) 20 MG capsule   Oral   Take 20 mg by mouth 2 (two) times daily.      3   . gabapentin (NEURONTIN) 600 MG tablet   Oral   Take 600 mg by mouth 2 (two) times daily.         . INVOKAMET [phone] MG TABS   Oral   Take 1 tablet by mouth 2 (two) times daily.      3     Dispense as written.   . isosorbide mononitrate (IMDUR) 60 MG 24 hr tablet   Oral   Take 60 mg by mouth daily.      3   . losartan (COZAAR) 50 MG tablet   Oral   Take 50 mg by mouth daily.      3   . oxycodone (ROXICODONE) 30 MG immediate release tablet   Oral   Take 30 mg by mouth 4 (four) times daily. For breakthrough pain.      0   . ranitidine (ZANTAC) 300 MG tablet   Oral   Take 300 mg by mouth daily.      3   . TRESIBA FLEXTOUCH  100 UNIT/ML SOPN   Subcutaneous   Inject 20 Units into the skin daily.      5     Dispense as written.   . verapamil (VERELAN PM) 360 MG 24 hr capsule   Oral   Take 360 mg by mouth daily.      5   . doxycycline (VIBRAMYCIN) 100 MG capsule   Oral   Take 1 capsule (100 mg total) by mouth 2 (two) times daily.   28 capsule   0     Allergies:  Review of patient's allergies indicates no known allergies.  Family History: History reviewed. No pertinent family history.  Social History: Social History  Substance Use Topics  . Smoking status: Current Every Day Smoker  . Smokeless tobacco: None  . Alcohol Use: No     Review of Systems:   10 point review of systems was performed and was otherwise negative:  Constitutional: No fever Eyes: No visual disturbances ENT: No sore throat, ear pain Cardiac: No chest pain Respiratory: No shortness of breath, wheezing, or stridor Abdomen: No abdominal pain, no vomiting, No diarrhea Endocrine: No weight loss, No night sweats Extremities: No peripheral edema, cyanosis Skin: No rashes, easy bruising Neurologic: No focal weakness, trouble  with speech or swollowing Urologic: No dysuria, Hematuria, or urinary frequency  Physical Exam:  ED Triage Vitals  Enc Vitals Group     BP 08/18/15 1914 148/92 mmHg     Pulse Rate 08/18/15 1914 98     Resp 08/18/15 1914 18     Temp 08/18/15 1914 98.3 F (36.8 C)     Temp Source 08/18/15 1914 Oral     SpO2 08/18/15 1914 93 %     Weight --      Height --      Head Cir --      Peak Flow --      Pain Score 08/18/15 1914 8     Pain Loc --      Pain Edu? --      Excl. in GC? --     General: Awake , Alert , and Oriented times 3; GCS 15 very anxious and occasionally agitated Head: Normal cephalic , atraumatic Eyes: Pupils equal , round, reactive to light Nose/Throat: No nasal drainage, patent upper airway without erythema or exudate.  Neck: Supple, Full range of motion, No anterior adenopathy or palpable thyroid masses Lungs: Clear to ascultation without wheezes , rhonchi, or rales Heart: Regular rate, regular rhythm without murmurs , gallops , or rubs Abdomen: Soft, non tender without rebound, guarding , or rigidity; bowel sounds positive and symmetric in all 4 quadrants. No organomegaly .        Extremities: 2 plus symmetric pulses. No edema, clubbing or cyanosis Neurologic: normal ambulation, Motor symmetric without deficits, sensory intact Skin: warm, dry, no rashes   Labs:   All laboratory work was reviewed including any pertinent negatives or positives listed below:  Labs Reviewed  CBC WITH DIFFERENTIAL/PLATELET - Abnormal; Notable for the following:    WBC 18.8 (*)    Neutro Abs 14.6 (*)    Basophils Absolute 0.3 (*)    All other components within normal limits  COMPREHENSIVE METABOLIC PANEL - Abnormal; Notable for the following:    Chloride 99 (*)    Glucose, Bld 147 (*)    Total Protein 8.8 (*)    All other components within normal limits  URINALYSIS COMPLETEWITH MICROSCOPIC ([facility] ONLY) - Abnormal; Notable for the following:  Color, Urine STRAW (*)     APPearance CLEAR (*)    Glucose, UA >500 (*)    Specific Gravity, Urine 1.002 (*)    All other components within normal limits  URINE DRUG SCREEN, QUALITATIVE ([facility] ONLY) - Abnormal; Notable for the following:    Cannabinoid 50 Ng, Ur [place] POSITIVE (*)    Benzodiazepine, Ur Scrn POSITIVE (*)    All other components within normal limits  ETHANOL      ED Course:  The patient was observed here and initially acquired his history at the bedside and the patient seemed to be of significant denial on the fact that he hasn't had suicidal ideation. He states he currently has no plan. Documentation from local police and also in conversation with his wife seems to shows conflicting stories. The wife was very concerned about the patient, and back home to the household with his son and stated that she would have to call the police if we determined that we would discharge him from the emergency department. Denies that the patient's had letters at home along with conversations with her and also individuals on Facebook that he had significant depression and agitation.   Assessment:  Suicidal ideation      Plan:  The patient had involuntary commitment paperwork filled out here in emergency department he was given Ativan for agitation and will be continued on his normal prescription medication. He states he has been trying to come off his Cymbalta and gave unusual dosage of the medication so we discussed with the pharmacy and was checked by the pharmacy technician what his current prescription shows.  All of his medications via pharmacy report will be continued here in emergency department.  Consultation with TTS along with psychiatry has  been established        Jennye Moccasin, MD 08/18/15 2313

## 2015-08-18 NOTE — BH Assessment (Addendum)
Assessment Note  Derek [name] is an 39 y.o. male. Pt presented as highly anxious. Pt expressed throughout assessment his displeased. with staff treatment and being held in the ED without his personal items. Pt stated multiple times "They have no clue who I am and what my family does".  Pt reports hx of depression "my whole life" and alcoholism (last use 2 years ago). Pt reports crying spells and low moods. Pt states "I have my sad moments because I'm coming off my SSRIs".    Pt denies HI, and hallucinations. Pt denies SI and intent and states "I did go overboard" and "I did it (put gun to his held) to shut her up"  Pt reports hx of SI at age 41. Pt was unable to provided additional emails and stated "I was always drunk, so I honestly can't remember what really happened". Pt denies any hx of inpatient hospitalizations.   Pt reports several guns in the home but, believes his friend has removed them from the home ("If I was going to kill myself, why would I call my friend to come pick up my guns").   Pt reports hx of neuropathy and difficulty ambulating without cane. Pt also reports difficulty with vision without eyeglasses.   Per IVC paperwork:  Respondent is diagnosed with depression; doctor has taken him off of his medication. Respondent took a gun, held it up to his throat, and asked his wife "would she be better off if I did this?", implying he was going to shoot himself. Respondent then told his wife if she called someone for help, it would get bad. Law enforcement went to the home and called the respondent, tried to talk him to come out of the house. Total call lasted 3 hours. Respondent finally came out on his own.    Writer consulted with Psych MD Dr. Guss Bunde and Pt is to be held and observed overnight and evaluated in the AM.    Diagnosis: Depression (Pt report)  Past Medical History:  Past Medical History  Diagnosis Date  . Diabetes mellitus without complication (HCC)   .  Chronic pain   . Coronary artery disease   . Diabetic neuropathy (HCC)   . Hypertension   . Hypercholesteremia     History reviewed. No pertinent past surgical history.  Family History: History reviewed. No pertinent family history.  Social History:  reports that he has been smoking.  He does not have any smokeless tobacco history on file. He reports that he does not drink alcohol or use illicit drugs.  Additional Social History:  Alcohol / Drug Use Pain Medications: Opioids as prescribed Prescriptions: As prescribed Over the Counter: None Provided History of alcohol / drug use?: Yes Longest period of sobriety (when/how long): Two years Substance #1 Name of Substance 1: Alcohol 1 - Age of First Use: Not Provided 1 - Amount (size/oz): Not Provided 1 - Frequency: Not Provdied 1 - Duration: Not Provided 1 - Last Use / Amount: Two years ago  CIWA: CIWA-Ar BP: (!) 148/92 mmHg Pulse Rate: 98 COWS:    Allergies: No Known Allergies  Home Medications:  (Not in a hospital admission)  OB/GYN Status:  No LMP for male patient.  General Assessment Data Location of Assessment: [facility] ED TTS Assessment: In system Is this a Tele or Face-to-Face Assessment?: Face-to-Face Is this an Initial Assessment or a Re-assessment for this encounter?: Initial Assessment Marital status: Married [place] name: NA Is patient pregnant?: No Pregnancy Status: No Living Arrangements:  Spouse/significant other Can pt return to current living arrangement?: Yes Admission Status: Involuntary Is patient capable of signing voluntary admission?: No Referral Source: Other Sport and exercise psychologist) Insurance type: Scientist, research (physical sciences) Exam [facility] Walk-in ONLY) Medical Exam completed: Yes  Crisis Care Plan Living Arrangements: Spouse/significant other Name of Psychiatrist: Current-Unable to recall name Name of Therapist: Patty Sprouce Risk analyst)  Education Status Is patient currently in school?: No Current  Grade: n Highest grade of school patient has completed: 12th' Name of school: NA Contact person: None Provided  Risk to self with the past 6 months Suicidal Ideation: No Has patient been a risk to self within the past 6 months prior to admission? : No Suicidal Intent: No Has patient had any suicidal intent within the past 6 months prior to admission? : No Is patient at risk for suicide?: Yes Suicidal Plan?: No (Pt denies but, held gun to his head ) Has patient had any suicidal plan within the past 6 months prior to admission? : No Access to Means: Yes Specify Access to Suicidal Means: Access to Guns What has been your use of drugs/alcohol within the last 12 months?: None Previous Attempts/Gestures: Yes How many times?: 1 Other Self Harm Risks: Poor emotional regulation, multiple guns in the home, MH hx, poor natural/family supports Intentional Self Injurious Behavior: None Family Suicide History: No Recent stressful life event(s): Conflict (Comment) (Conflict with wife) Persecutory voices/beliefs?: No Depression: Yes Depression Symptoms: Tearfulness Substance abuse history and/or treatment for substance abuse?: Yes Suicide prevention information given to non-admitted patients: Not applicable  Risk to Others within the past 6 months Homicidal Ideation: No Does patient have any lifetime risk of violence toward others beyond the six months prior to admission? : No Thoughts of Harm to Others: No Current Homicidal Intent: No Current Homicidal Plan: No Access to Homicidal Means: No Identified Victim: NA History of harm to others?:  (None Reported) Assessment of Violence: None Noted Violent Behavior Description: NA Does patient have access to weapons?: Yes (Comment) (Access to Guns) Criminal Charges Pending?: No Does patient have a court date: No Is patient on probation?: No  Psychosis Hallucinations: None noted Delusions: None noted  Mental Status Report Appearance/Hygiene:  In scrubs Eye Contact: Good Motor Activity: Restlessness Speech: Logical/coherent (Irritable speech content) Level of Consciousness: Alert, Irritable Mood: Irritable, Anxious Affect: Anxious, Irritable Anxiety Level: Moderate Thought Processes: Coherent, Relevant Judgement: Partial Orientation: Person, Place, Time, Appropriate for developmental age, Situation Obsessive Compulsive Thoughts/Behaviors: Minimal  Cognitive Functioning Concentration: Fair Memory: Recent Intact, Remote Intact IQ: Average Insight: Poor Impulse Control: Poor Appetite: Good Weight Loss:  (Not Reported) Weight Gain:  (Not Reported) Sleep: Unable to Assess Total Hours of Sleep:  (Not Reported) Vegetative Symptoms: Unable to Assess  ADLScreening [facility] Assessment Services) Patient's cognitive ability adequate to safely complete daily activities?: Yes Patient able to express need for assistance with ADLs?: Yes Independently performs ADLs?: No (With assistive devices)  Prior Inpatient Therapy Prior Inpatient Therapy: No Prior Therapy Dates: NA Prior Therapy Facilty/Provider(s): NA Reason for Treatment: NA  Prior Outpatient Therapy Prior Outpatient Therapy: Yes Prior Therapy Dates: Current Prior Therapy Facilty/Provider(s): Patty Sprouce ([place]) Reason for Treatment: Depression Does patient have an ACCT team?: No Does patient have Intensive In-House Services?  : No Does patient have [facility] services? : No Does patient have P4CC services?: No  ADL Screening (condition at time of admission) Patient's cognitive ability adequate to safely complete daily activities?: Yes Is the patient deaf or have difficulty hearing?: No Does the  patient have difficulty seeing, even when wearing glasses/contacts?: Yes Does the patient have difficulty concentrating, remembering, or making decisions?: Yes Patient able to express need for assistance with ADLs?: Yes Does the patient have difficulty dressing or bathing?:  Yes Independently performs ADLs?: No (With assistive devices) Communication: Independent Dressing (OT): Independent Grooming: Independent Feeding: Independent Bathing: Independent Toileting: Independent In/Out Bed: Independent with device (comment) Walks in Home: Independent with device (comment) Does the patient have difficulty walking or climbing stairs?: Yes Weakness of Legs:  (Not Reported) Weakness of Arms/Hands:  (Not Reported)  Home Assistive Devices/Equipment Home Assistive Devices/Equipment: Eyeglasses, Medical laboratory scientific officerCane (specify quad or straight)  Therapy Consults (therapy consults require a physician order) PT Evaluation Needed: No OT Evalulation Needed: No SLP Evaluation Needed: No Abuse/Neglect Assessment (Assessment to be complete while patient is alone) Physical Abuse:  (Not Reported) Verbal Abuse:  (Not Reported) Sexual Abuse:  (Not Reported) Exploitation of patient/patient's resources:  (Not Reported) Self-Neglect:  (Not Reported) Possible abuse reported to::  (Not Reported) Values / Beliefs Cultural Requests During Hospitalization: None Spiritual Requests During Hospitalization: None Consults Spiritual Care Consult Needed: No Social Work Consult Needed: No Merchant navy officerAdvance Directives (For Healthcare) Does patient have an advance directive?:  (Not Reported)    Additional Information 1:1 In Past 12 Months?: No CIRT Risk: No Elopement Risk: No Does patient have medical clearance?: No     Disposition:  Disposition Initial Assessment Completed for this Encounter: Yes Disposition of Patient: Other dispositions (Psych MD Consult)  On Site Evaluation by:   Reviewed with Physician:    Mikenzie Mccannon J [place] 08/18/2015 11:11 PM

## 2015-08-18 NOTE — ED Notes (Signed)
BEHAVIORAL HEALTH ROUNDING  Patient sleeping: No.  Patient alert and oriented: yes  Behavior appropriate: Yes. ; If no, describe:  Nutrition and fluids offered: Yes  Toileting and hygiene offered: Yes  Sitter present: not applicable  Law enforcement present: Yes ODS  

## 2015-08-18 NOTE — ED Notes (Signed)
Patient states he does not give permission to contact wife re his meds and he does not give permission to have friend who has his belongings get his med list out of the pocket of his clothes.

## 2015-08-18 NOTE — ED Notes (Signed)
Spoke with wife who reports pt has anxiety problems and today had guns all around the house and that pt has been making suicidal threats recently. Pharmacy Tech also spoke with wife and updated his medication.

## 2015-08-19 LAB — GLUCOSE, CAPILLARY: Glucose-Capillary: 141 mg/dL — ABNORMAL HIGH (ref 65–99)

## 2015-08-19 MED ORDER — NICOTINE POLACRILEX 2 MG MT GUM
2.0000 mg | CHEWING_GUM | OROMUCOSAL | Status: DC | PRN
  Administered 2015-08-19: 2 mg via ORAL
  Filled 2015-08-19: qty 1

## 2015-08-19 MED ORDER — NICOTINE 21 MG/24HR TD PT24
21.0000 mg | MEDICATED_PATCH | Freq: Once | TRANSDERMAL | Status: AC
  Administered 2015-08-19 – 2015-08-20 (×2): 21 mg via TRANSDERMAL
  Filled 2015-08-19: qty 1

## 2015-08-19 MED ORDER — FAMOTIDINE 20 MG PO TABS
40.0000 mg | ORAL_TABLET | Freq: Two times a day (BID) | ORAL | Status: DC
  Administered 2015-08-19 – 2015-08-20 (×2): 40 mg via ORAL
  Filled 2015-08-19 (×2): qty 2

## 2015-08-19 MED ORDER — DULOXETINE HCL 20 MG PO CPEP
40.0000 mg | ORAL_CAPSULE | Freq: Two times a day (BID) | ORAL | Status: DC
  Administered 2015-08-19 – 2015-08-20 (×2): 40 mg via ORAL
  Filled 2015-08-19 (×4): qty 2

## 2015-08-19 MED ORDER — INSULIN GLARGINE 100 UNIT/ML ~~LOC~~ SOLN
20.0000 [IU] | Freq: Once | SUBCUTANEOUS | Status: AC
Start: 2015-08-19 — End: 2015-08-19
  Administered 2015-08-19: 20 [IU] via SUBCUTANEOUS
  Filled 2015-08-19: qty 0.2

## 2015-08-19 MED ORDER — NICOTINE 10 MG IN INHA: 1.0000 | RESPIRATORY_TRACT | Status: DC | PRN

## 2015-08-19 MED ORDER — NICOTINE 21 MG/24HR TD PT24
MEDICATED_PATCH | TRANSDERMAL | Status: AC
  Administered 2015-08-19: 21 mg via TRANSDERMAL
  Filled 2015-08-19: qty 1

## 2015-08-19 NOTE — BH Assessment (Signed)
Writer spoke with patient to get an update on his current mental and emotional status.  He states, he slept well and he was ready to go home.  He further reports, "I know what I did was stupid. I don't want to kill myself. I'm spiritual and I believe if you kill yourself, you will go to hell. And hell will be a lot worse than the pain I'm in." Other protective factors he shared were; his wife and 37-year-old son.  Spoke with patient's wife ([phone]), she reported, she left the house when the patient pulled his gun out and put it to his head. She further states, they have a 37-year-old son and she didn't feel comfortable with staying in the home with him. When she got back home, the police had already been to the home and removed him. She also states, Patent examinerLaw Enforcement removed his guns from the home.  However, the morning, she spoke with his brother and he states, the patient may have one more gun in the home. It's in a locked place and the wife don't have a key to open it, to check.   Wife also states, he made several post on FaceBook, stating he was going to harm himself.  She also confirmed the patient is receiving Outpatient Treatment ([facility]ty [phone].) and she spoke with the counselor today. The counselor is willing to talked with someone at the hospital for further collateral information.

## 2015-08-19 NOTE — ED Notes (Signed)
BEHAVIORAL HEALTH ROUNDING Patient sleeping: Yes.   Patient alert and oriented: not applicable SLEEPING Behavior appropriate: Yes.  ; If no, describe: SLEEPING Nutrition and fluids offered: No SLEEPING Toileting and hygiene offered: NoSLEEPING Sitter present: not applicable Law enforcement present: Yes ODS 

## 2015-08-19 NOTE — ED Provider Notes (Signed)
-----------------------------------------   6:46 AM on 08/19/2015 -----------------------------------------   Blood pressure 148/92, pulse 98, temperature 98.3 F (36.8 C), temperature source Oral, resp. rate 18, SpO2 93 %.  The patient had no acute events since last update.  Calm and cooperative at this time.  Disposition is pending per Psychiatry/Behavioral Medicine team recommendations.       Loleta Roseory Hallee Mckenny, MD 08/19/15 [phone]

## 2015-08-19 NOTE — ED Notes (Signed)
Wife came out to desk tearful and now states the cymbalta dosage is 92?mg daily and wants us to give her husband more cymbalta. Explained to her that she had verified all the meds last night as well as the pharmacy tech verifying the info from their pharmacy and we could not increase his dose. Also let her know that the psychiatrist would be in and then she would evaluate his meds.

## 2015-08-19 NOTE — ED Notes (Signed)
Given the phone to speak with wife

## 2015-08-19 NOTE — ED Notes (Signed)
BEHAVIORAL HEALTH ROUNDING Patient sleeping: No. Patient alert and oriented: yes Behavior appropriate: Yes.  ; If no, describe:  Nutrition and fluids offered: Yes  Toileting and hygiene offered: Yes  Sitter present: no Law enforcement present: Yes  

## 2015-08-19 NOTE — ED Notes (Signed)

## 2015-08-19 NOTE — ED Notes (Signed)
BEHAVIORAL HEALTH ROUNDING Patient sleeping: no Patient alert and oriented: yes Behavior appropriate: Yes.  ; If no, describe:  Nutrition and fluids offered: Yes  Toileting and hygiene offered: Yes  Sitter present: no Law enforcement present: Yes  

## 2015-08-19 NOTE — ED Notes (Signed)
[facility] states the pt has been stating his cymbalta dosage is wrong but they had verified with wife and his pharmacy the correct dosage.

## 2015-08-19 NOTE — ED Notes (Signed)
Pharmacy tech [facility] [place] and verified the pt's cymbalta dosage is 20mg  bid

## 2015-08-19 NOTE — ED Notes (Signed)
ED BHU PLACEMENT JUSTIFICATION Is the patient under IVC or is there intent for IVC: Yes.   Is the patient medically cleared: Yes.   Is there vacancy in the ED BHU: Yes.   Is the population mix appropriate for patient: No. Is the patient awaiting placement in inpatient or outpatient setting: Yes.   Has the patient had a psychiatric consult: No. Survey of unit performed for contraband, proper placement and condition of furniture, tampering with fixtures in bathroom, shower, and each patient room: Yes.  ; Findings:  APPEARANCE/BEHAVIOR calm and cooperative NEURO ASSESSMENT Orientation: time, place and person Hallucinations: No.None noted (Hallucinations) Speech: Normal Gait: normal RESPIRATORY ASSESSMENT Normal expansion.  Clear to auscultation.  No rales, rhonchi, or wheezing. CARDIOVASCULAR ASSESSMENT regular rate and rhythm, S1, S2 normal, no murmur, click, rub or gallop GASTROINTESTINAL ASSESSMENT soft, nontender, BS WNL, no r/g EXTREMITIES normal strength, tone, and muscle mass PLAN OF CARE Provide calm/safe environment. Vital signs assessed twice daily. ED BHU Assessment once each 12-hour shift. Collaborate with intake RN daily or as condition indicates. Assure the ED provider has rounded once each shift. Provide and encourage hygiene. Provide redirection as needed. Assess for escalating behavior; address immediately and inform ED provider.  Assess family dynamic and appropriateness for visitation as needed: Yes.  ; If necessary, describe findings:  Educate the patient/family about BHU procedures/visitation: Yes.  ; If necessary, describe findings:

## 2015-08-19 NOTE — ED Notes (Signed)
Pt up using restroom at this time, pt asking about phone hours, this tech explained

## 2015-08-19 NOTE — ED Notes (Signed)
Pt awakened so wife could visit
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
## 2015-08-19 NOTE — ED Notes (Signed)
Pt up to bathroom. Asking for his cane and his glasses. Both items were sent home with friend. Pt offered use of a walker and said that would be helpful. Pt up to bathroom with use of the walker and no difficulty noted.

## 2015-08-20 ENCOUNTER — Inpatient Hospital Stay
Admission: RE | Admit: 2015-08-20 | Discharge: 2015-08-22 | DRG: 885 | Disposition: A | Discharge status: Home / Self Care | Payer: BLUE CROSS/BLUE SHIELD | Source: Intra-hospital | Attending: Psychiatry | Admitting: Psychiatry

## 2015-08-20 DIAGNOSIS — Z79899 Other long term (current) drug therapy: Secondary | ICD-10-CM

## 2015-08-20 DIAGNOSIS — Z823 Family history of stroke: Secondary | ICD-10-CM | POA: Diagnosis not present

## 2015-08-20 DIAGNOSIS — F17200 Nicotine dependence, unspecified, uncomplicated: Secondary | ICD-10-CM

## 2015-08-20 DIAGNOSIS — G4733 Obstructive sleep apnea (adult) (pediatric): Secondary | ICD-10-CM | POA: Diagnosis present

## 2015-08-20 DIAGNOSIS — F332 Major depressive disorder, recurrent severe without psychotic features: Secondary | ICD-10-CM | POA: Diagnosis present

## 2015-08-20 DIAGNOSIS — I10 Essential (primary) hypertension: Secondary | ICD-10-CM | POA: Diagnosis present

## 2015-08-20 DIAGNOSIS — E1140 Type 2 diabetes mellitus with diabetic neuropathy, unspecified: Secondary | ICD-10-CM

## 2015-08-20 DIAGNOSIS — F1290 Cannabis use, unspecified, uncomplicated: Secondary | ICD-10-CM | POA: Diagnosis present

## 2015-08-20 DIAGNOSIS — G4700 Insomnia, unspecified: Secondary | ICD-10-CM | POA: Diagnosis present

## 2015-08-20 DIAGNOSIS — F17210 Nicotine dependence, cigarettes, uncomplicated: Secondary | ICD-10-CM | POA: Diagnosis present

## 2015-08-20 DIAGNOSIS — E1142 Type 2 diabetes mellitus with diabetic polyneuropathy: Secondary | ICD-10-CM

## 2015-08-20 DIAGNOSIS — Z7982 Long term (current) use of aspirin: Secondary | ICD-10-CM | POA: Diagnosis not present

## 2015-08-20 DIAGNOSIS — Z818 Family history of other mental and behavioral disorders: Secondary | ICD-10-CM | POA: Diagnosis not present

## 2015-08-20 DIAGNOSIS — E119 Type 2 diabetes mellitus without complications: Secondary | ICD-10-CM

## 2015-08-20 DIAGNOSIS — E785 Hyperlipidemia, unspecified: Secondary | ICD-10-CM

## 2015-08-20 DIAGNOSIS — F1210 Cannabis abuse, uncomplicated: Secondary | ICD-10-CM | POA: Diagnosis not present

## 2015-08-20 DIAGNOSIS — G8929 Other chronic pain: Secondary | ICD-10-CM | POA: Diagnosis present

## 2015-08-20 DIAGNOSIS — I2510 Atherosclerotic heart disease of native coronary artery without angina pectoris: Secondary | ICD-10-CM | POA: Diagnosis present

## 2015-08-20 LAB — GLUCOSE, CAPILLARY: GLUCOSE-CAPILLARY: 141 mg/dL — AB (ref 65–99)

## 2015-08-20 MED ORDER — CANAGLIFLOZIN-METFORMIN HCL 150-1000 MG PO TABS: 1.0000 | ORAL_TABLET | Freq: Two times a day (BID) | ORAL | Status: DC

## 2015-08-20 MED ORDER — ISOSORBIDE MONONITRATE ER 60 MG PO TB24
60.0000 mg | ORAL_TABLET | Freq: Every day | ORAL | Status: DC
  Administered 2015-08-20 – 2015-08-22 (×3): 60 mg via ORAL
  Filled 2015-08-20 (×3): qty 1

## 2015-08-20 MED ORDER — ACETAMINOPHEN 325 MG PO TABS
650.0000 mg | ORAL_TABLET | Freq: Four times a day (QID) | ORAL | Status: DC | PRN
  Administered 2015-08-20 – 2015-08-21 (×2): 650 mg via ORAL
  Filled 2015-08-20 (×2): qty 2

## 2015-08-20 MED ORDER — VERAPAMIL HCL ER 180 MG PO TBCR
360.0000 mg | EXTENDED_RELEASE_TABLET | Freq: Every day | ORAL | Status: DC
  Administered 2015-08-20: 360 mg via ORAL
  Administered 2015-08-21: 180 mg via ORAL
  Administered 2015-08-22: 360 mg via ORAL
  Filled 2015-08-20 (×3): qty 2

## 2015-08-20 MED ORDER — DULOXETINE HCL 30 MG PO CPEP
60.0000 mg | ORAL_CAPSULE | Freq: Every day | ORAL | Status: DC
  Administered 2015-08-20 – 2015-08-21 (×2): 60 mg via ORAL
  Filled 2015-08-20 (×3): qty 2

## 2015-08-20 MED ORDER — DIAZEPAM 5 MG PO TABS
10.0000 mg | ORAL_TABLET | Freq: Three times a day (TID) | ORAL | Status: DC | PRN
  Administered 2015-08-20 – 2015-08-21 (×2): 10 mg via ORAL
  Filled 2015-08-20 (×3): qty 2

## 2015-08-20 MED ORDER — INSULIN GLARGINE 100 UNIT/ML ~~LOC~~ SOLN
20.0000 [IU] | Freq: Every day | SUBCUTANEOUS | Status: DC
  Administered 2015-08-21 – 2015-08-22 (×2): 20 [IU] via SUBCUTANEOUS
  Filled 2015-08-20 (×3): qty 0.2

## 2015-08-20 MED ORDER — ACETAMINOPHEN 325 MG PO TABS: 650.0000 mg | ORAL_TABLET | Freq: Four times a day (QID) | ORAL | Status: DC | PRN

## 2015-08-20 MED ORDER — ALUM & MAG HYDROXIDE-SIMETH 200-200-20 MG/5ML PO SUSP: 30.0000 mL | ORAL | Status: DC | PRN

## 2015-08-20 MED ORDER — ROSUVASTATIN CALCIUM 20 MG PO TABS
20.0000 mg | ORAL_TABLET | Freq: Every day | ORAL | Status: DC
Start: 2015-08-20 — End: 2015-08-22
  Administered 2015-08-20 – 2015-08-22 (×3): 20 mg via ORAL
  Filled 2015-08-20 (×3): qty 1

## 2015-08-20 MED ORDER — FAMOTIDINE 20 MG PO TABS
20.0000 mg | ORAL_TABLET | Freq: Two times a day (BID) | ORAL | Status: DC
  Administered 2015-08-20 – 2015-08-22 (×5): 20 mg via ORAL
  Filled 2015-08-20 (×7): qty 1

## 2015-08-20 MED ORDER — NICOTINE 21 MG/24HR TD PT24
21.0000 mg | MEDICATED_PATCH | Freq: Every day | TRANSDERMAL | Status: DC
  Administered 2015-08-21 – 2015-08-22 (×2): 21 mg via TRANSDERMAL
  Filled 2015-08-20 (×2): qty 1

## 2015-08-20 MED ORDER — INSULIN DEGLUDEC 100 UNIT/ML ~~LOC~~ SOPN: 20.0000 [IU] | PEN_INJECTOR | Freq: Every day | SUBCUTANEOUS | Status: DC

## 2015-08-20 MED ORDER — LOSARTAN POTASSIUM 25 MG PO TABS
50.0000 mg | ORAL_TABLET | Freq: Every day | ORAL | Status: DC
  Administered 2015-08-20 – 2015-08-22 (×3): 50 mg via ORAL
  Filled 2015-08-20 (×3): qty 2

## 2015-08-20 MED ORDER — CANAGLIFLOZIN 300 MG PO TABS
300.0000 mg | ORAL_TABLET | Freq: Every day | ORAL | Status: DC
  Administered 2015-08-21 – 2015-08-22 (×2): 300 mg via ORAL
  Filled 2015-08-20 (×3): qty 300

## 2015-08-20 MED ORDER — MAGNESIUM HYDROXIDE 400 MG/5ML PO SUSP: 30.0000 mL | Freq: Every day | ORAL | Status: DC | PRN

## 2015-08-20 MED ORDER — METFORMIN HCL 500 MG PO TABS
1000.0000 mg | ORAL_TABLET | Freq: Two times a day (BID) | ORAL | Status: DC
  Administered 2015-08-20 – 2015-08-22 (×4): 1000 mg via ORAL
  Filled 2015-08-20 (×4): qty 2

## 2015-08-20 MED ORDER — GABAPENTIN 600 MG PO TABS
600.0000 mg | ORAL_TABLET | Freq: Two times a day (BID) | ORAL | Status: DC
Start: 2015-08-20 — End: 2015-08-22
  Administered 2015-08-20 – 2015-08-22 (×5): 600 mg via ORAL
  Filled 2015-08-20 (×6): qty 1

## 2015-08-20 MED ORDER — OXYCODONE HCL 5 MG PO TABS
30.0000 mg | ORAL_TABLET | Freq: Four times a day (QID) | ORAL | Status: DC | PRN
  Administered 2015-08-20: 30 mg via ORAL
  Filled 2015-08-20: qty 6

## 2015-08-20 MED ORDER — TRAZODONE HCL 100 MG PO TABS: 100.0000 mg | ORAL_TABLET | Freq: Every evening | ORAL | Status: DC | PRN

## 2015-08-20 MED ORDER — ASPIRIN EC 81 MG PO TBEC
81.0000 mg | DELAYED_RELEASE_TABLET | Freq: Every day | ORAL | Status: DC
  Administered 2015-08-21 – 2015-08-22 (×2): 81 mg via ORAL
  Filled 2015-08-20 (×2): qty 1

## 2015-08-20 MED ORDER — CLOPIDOGREL BISULFATE 75 MG PO TABS
75.0000 mg | ORAL_TABLET | Freq: Every day | ORAL | Status: DC
Start: 2015-08-20 — End: 2015-08-22
  Administered 2015-08-21 – 2015-08-22 (×2): 75 mg via ORAL
  Filled 2015-08-20 (×3): qty 1

## 2015-08-20 MED ORDER — VERAPAMIL HCL 120 MG PO TABS
360.0000 mg | ORAL_TABLET | Freq: Every day | ORAL | Status: DC
  Filled 2015-08-20: qty 3

## 2015-08-20 MED ORDER — DULOXETINE HCL 20 MG PO CPEP
40.0000 mg | ORAL_CAPSULE | Freq: Every day | ORAL | Status: DC
  Administered 2015-08-21 – 2015-08-22 (×2): 40 mg via ORAL
  Filled 2015-08-20 (×2): qty 2

## 2015-08-20 MED ORDER — IBUPROFEN 400 MG PO TABS: 400.0000 mg | ORAL_TABLET | Freq: Four times a day (QID) | ORAL | Status: DC | PRN

## 2015-08-20 NOTE — [facility] Group Notes (Signed)
[facility]   08/20/2015 2:59 PM  Participation Quality:  Did not attend    Mercy RidingJonathan F Izaiha Lo, LCSWA, LCAS  08/20/15

## 2015-08-20 NOTE — Progress Notes (Signed)
Recreation Therapy Notes  Date: 12.12.16 Time: 3:00 pm Location: Craft Room  Group Topic: Wellness  Goal Area(s) Addresses:  Patient will identify at least one item per dimension of health. Patient will examine areas they are deficient in.  Behavioral Response: Did not attend  Intervention: 6 Dimensions of Health  Activity: Patients were given a worksheet with the definitions of the 6 Dimension of Health and examples of each dimension. Patients were given a worksheet with the 6 dimensions on it and instructed to list 2-3 things in each dimension.  Education: LRT educated patients on how this activity was related to their admission and d/c from the hospital.  Education Outcome: Patient did not attend group.   Clinical Observations/Feedback: Patient did not attend group.  Jacquelynn CreeGreene,Elo Marmolejos M, LRT/CTRS 08/20/2015 4:38 PM
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
## 2015-08-20 NOTE — [facility] Group Notes (Signed)
[facility] LCSW Group Therapy   08/20/2015 1:15 PM   Type of Therapy: Group Therapy   Participation Level: Did Not Attend. Patient invited to participate but declined.    Alyn Jurney F. Abree Romick, MSW, LCSWA, LCAS   

## 2015-08-20 NOTE — [facility] Suicide Risk Assessment (Signed)
[facility]   Nursing information obtained from:    Demographic factors:    Current Mental Status:    Loss Factors:    Historical Factors:    Risk Reduction Factors:    Total Time spent with patient: 1 hour Principal Problem: Major depressive disorder, recurrent episode, severe (HCC) Diagnosis:   Patient Active Problem List   Diagnosis Date Noted  . Diabetes (HCC) [E11.9] 08/20/2015  . HTN (hypertension) [I10] 08/20/2015  . OSA (obstructive sleep apnea) [G47.33] 08/20/2015  . Diabetic neuropathy (HCC) [E11.40] 08/20/2015  . Dyslipidemia [E78.5] 08/20/2015  . Coronary artery disease [I25.10] 08/20/2015  . Tobacco use disorder [F17.200] 08/20/2015  . Major depressive disorder, recurrent episode, severe (HCC) [F33.2] 08/20/2015  . MDD (major depressive disorder), recurrent episode, severe (HCC) [F33.2] 08/20/2015     Continued Clinical Symptoms:  Alcohol Use Disorder Identification Test Final Score (AUDIT): 0 The "Alcohol Use Disorders Identification Test", Guidelines for Use in Primary Care, Second Edition.  World Science writer[employer] [facility](WHO). Score between 0-7:  no or low risk or alcohol related problems. Score between 8-15:  moderate risk of alcohol related problems. Score between 16-19:  high risk of alcohol related problems. Score 20 or above:  warrants further diagnostic evaluation for alcohol dependence and treatment.   CLINICAL FACTORS:   Depression:   Impulsivity Severe Personality Disorders:   Cluster B Comorbid depression Chronic Pain Previous Psychiatric Diagnoses and Treatments Medical Diagnoses and Treatments/Surgeries  Psychiatric Specialty Exam: Physical Exam  ROS    COGNITIVE FEATURES THAT CONTRIBUTE TO RISK:  None    SUICIDE RISK:   Moderate:  Frequent suicidal ideation with limited intensity, and duration, some specificity in terms of plans, no associated intent, good self-control, limited dysphoria/symptomatology, some risk  factors present, and identifiable protective factors, including available and accessible social support.  [facility]  Medical Decision Making:  Established Problem, Worsening (2)  I certify that inpatient services furnished can reasonably be expected to improve the patient's condition.   Derek [name],  Derek [name] 08/20/2015, 3:10 PM

## 2015-08-20 NOTE — Tx Team (Signed)
Initial Interdisciplinary Treatment Plan   PATIENT STRESSORS: Health problems Marital or family conflict   PATIENT STRENGTHS: Average or above average intelligence Communication skills Motivation for treatment/growth Supportive family/friends   PROBLEM LIST: Problem List/Patient Goals Date to be addressed Date deferred Reason deferred Estimated date of resolution  Depression 08/20/2015     Suicidal ideation 08/20/2015                                                DISCHARGE CRITERIA:  Ability to meet basic life and health needs Adequate post-discharge living arrangements Verbal commitment to aftercare and medication compliance  PRELIMINARY DISCHARGE PLAN: Attend PHP/IOP Return to previous living arrangement  PATIENT/FAMIILY INVOLVEMENT: This treatment plan has been presented to and reviewed with the patient, Delrae AlfredKevin J Fulop, and/or family member,   The patient and family have been given the opportunity to ask questions and make suggestions.  Margo CommonGigi George Takyla Kuchera 08/20/2015, 5:51 PM

## 2015-08-20 NOTE — Progress Notes (Signed)
[age]yrs male admitted with depression.He denies suicidal ideation.Cooperative on admission.Stated that he uses cane at home & had multiple falls at home.No contraband found on admission.

## 2015-08-20 NOTE — ED Provider Notes (Addendum)
-----------------------------------------   10:09 AM on 08/20/2015 -----------------------------------------   Blood pressure 115/100, pulse 83, temperature 98.1 F (36.7 C), temperature source Oral, resp. rate 18, weight 222 lb 6 oz (100.869 kg), SpO2 97 %.  The patient had no acute events since last update.  Calm and cooperative at this time.  Per psychiatry the patient will likely be admitted for further treatment however there is no bed available at this time.   Minna AntisKevin Suzi Hernan, MD 08/20/15 1009    ----------------------------------------- 11:48 AM on 08/20/2015 -----------------------------------------  Patient has a bed available, will be admitted to psychiatry for further treatment and evaluation.  Minna AntisKevin Alizey Noren, MD 08/20/15 1148  Minna AntisKevin Rudolfo Brandow, MD 08/20/15 [phone]

## 2015-08-21 LAB — GLUCOSE, CAPILLARY
GLUCOSE-CAPILLARY: 197 mg/dL — AB (ref 65–99)
Glucose-Capillary: 180 mg/dL — ABNORMAL HIGH (ref 65–99)

## 2015-08-21 LAB — HEMOGLOBIN A1C: Hgb A1c MFr Bld: 6.7 % — ABNORMAL HIGH (ref 4.0–6.0)

## 2015-08-21 LAB — TSH: TSH: 0.309 u[IU]/mL — AB (ref 0.350–4.500)

## 2015-08-21 MED ORDER — OXYCODONE HCL 5 MG PO TABS
30.0000 mg | ORAL_TABLET | ORAL | Status: DC | PRN
  Administered 2015-08-21 – 2015-08-22 (×4): 30 mg via ORAL
  Administered 2015-08-22: 5 mg via ORAL
  Filled 2015-08-21 (×5): qty 6

## 2015-08-21 MED ORDER — DIAZEPAM 5 MG PO TABS
10.0000 mg | ORAL_TABLET | Freq: Three times a day (TID) | ORAL | Status: DC
  Administered 2015-08-21 – 2015-08-22 (×3): 10 mg via ORAL
  Filled 2015-08-21 (×4): qty 2

## 2015-08-21 NOTE — Progress Notes (Signed)
Pt has home Cpap unit. Checked by RT, RN will call if pt needs any assistance. Called Clinical engineereing/bio-med to check.

## 2015-08-21 NOTE — Progress Notes (Signed)
D: Patient stated slept poor last night .Stated appetite is good and energy level low Stated concentration is good . Stated on Depression scale 0 , hopeless 0 and anxiety 2  .( low 0-10 high) Denies suicidal  homicidal ideations  .  No auditory hallucinations  No pain concerns . Appropriate ADL'S. Interacting with peers and staff..  . Stated his goal   Was to focus on medications  And better relationship with family. A: Encourage patient participation with unit programming . Instruction  Given on  Medication , verbalize understanding. R: Voice no other concerns. Staff continue to monitor

## 2015-08-21 NOTE — Plan of Care (Signed)
Problem: Alteration in mood Goal: STG-Patient is able to discuss feelings and issues (Patient is able to discuss feelings and issues leading to depression)  Outcome: Progressing Patient was able to tell me why he was here and certain needs like needing his CPAP before he went to bed. He was also able to tell me issues with the medication that he wanted to be brought up to the doctor.
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
## 2015-08-21 NOTE — Progress Notes (Signed)
Recreation Therapy Notes  Date: 12.13.16 Time: 3:00 pm Location: Craft Room  Group Topic: Self-expression  Goal Area(s) Addresses:  Patient will effectively use art as a means of self-expression. Patient will recognize positive benefit of self-expression. Patient will be able to identify one emotion experienced during group session. Patient will identify use of art/self-expression as a coping skill.  Behavioral Response: Attentive, Interactive  Intervention: Two Faces of Me  Activity: Patients were given a blank face worksheet and instructed to draw a line down the middle. On one side, patients were instructed to draw or write how they felt when they were admitted to the hospital and on the other side they were instructed to draw or write how they want to feel when they are d/c.  Education:LRT educated patient on different forms of self-expression.  Education Outcome: Acknowledges education/In group clarification offered  Clinical Observations/Feedback: Patient completed activity by drawing how he felt when he was admitted to the hospital and how he wants to feel when he is d/c. Patient contributed to group discussion by stating how his faces are different, what he has learned in the hospital to help him with positive change, and what his plan is to maintain that difference.  Jacquelynn CreeGreene,Derek [name], LRT/CTRS 08/21/2015 4:33 PM

## 2015-08-21 NOTE — [facility] Suicide Risk Assessment (Signed)
[facility] INPATIENT:  Family/Significant Other Suicide Prevention Education  Suicide Prevention Education:  Education Completed; Derek [name] (wife) [phone] has been identified by the patient as the family member/significant other with whom the patient will be residing, and identified as the person(s) who will aid the patient in the event of a mental health crisis (suicidal ideations/suicide attempt).  With written consent from the patient, the family member/significant other has been provided the following suicide prevention education, prior to the and/or following the discharge of the patient.  The suicide prevention education provided includes the following:  Suicide risk factors  Suicide prevention and interventions  National Suicide Hotline telephone number  [facility] assessment telephone number  [facility] Emergency Assistance 911  [facility] and/or Residential Mobile Crisis Unit telephone number  Request made of family/significant other to:  Remove weapons (e.g., guns, rifles, knives), all items previously/currently identified as safety concern.    Remove drugs/medications (over-the-counter, prescriptions, illicit drugs), all items previously/currently identified as a safety concern.  The family member/significant other verbalizes understanding of the suicide prevention education information provided.  The family member/significant other agrees to remove the items of safety concern listed above.  Lulu RidingIngle, Markese Bloxham T, MSW, LCSWA 08/21/2015, 3:25 PM

## 2015-08-21 NOTE — Plan of Care (Signed)
Problem: Alteration in mood Goal: LTG-Patient reports reduction in suicidal thoughts (Patient reports reduction in suicidal thoughts and is able to verbalize a safety plan for whenever patient is feeling suicidal)  Outcome: Progressing Denies suicidal ideations     

## 2015-08-21 NOTE — Progress Notes (Signed)
D: Patient appears depressed. States pain at a 8 in his legs. Denies SI/HI/AVH. Slightly irritable when he found out he could not get his oxycodone as often as he gets at home.  A: Medication given with education. Encouragement provided. MD called to talk about medication but no change made.  R: Patient calm and cooperative. Compliant with medication. Safety maintained with 15 min checks.

## 2015-08-21 NOTE — [facility] Group Notes (Signed)
[facility] LCSW Group Therapy   08/21/2015 1:15 PM   Type of Therapy: Group Therapy   Participation Level: Active   Participation Quality: Attentive, Sharing and Supportive   Affect: Appropriate  Cognitive: Alert and Oriented   Insight: Developing/Improving and Engaged   Engagement in Therapy: Developing/Improving and Engaged   Modes of Intervention: Clarification, Confrontation, Discussion, Education, Exploration,  Limit-setting, Orientation, Problem-solving, Rapport Building, Dance movement psychotherapisteality Testing, Socialization and Support  Summary of Progress/Problems: The topic for group therapy was feelings about diagnosis. Pt actively participated in group discussion on their past and current diagnosis and how they feel towards this. Pt also identified how society and family members judge them, based on their diagnosis as well as stereotypes and stigmas. Pt described her having felt "worthless" when learning of her mental health diagnosis.  Pt shared during group that she felt that there is definately a stigma that is attached to mental illness and that this is a barrier for him in his recovery.  Pt felt that her mental health diagnosis has adversely affected his relationships and causes him to isolate.  Pt was polite, cooperative and attentive to group sharing.     Dorothe PeaJonathan F. Annalynn Centanni, LCSWA, LCAS 08/21/15
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
## 2015-08-21 NOTE — [facility] Group Notes (Signed)
[facility] Group Notes:  (Nursing/MHT/Case Management/Adjunct)  Date:  08/21/2015  Time:  2:07 PM  Type of Therapy:  Psychoeducational Skills  Participation Level:  Did Not Attend    Arelia LongestLinda F Jakai Risse 08/21/2015, 2:07 PM

## 2015-08-21 NOTE — Progress Notes (Signed)
Recreation Therapy Notes  INPATIENT RECREATION THERAPY ASSESSMENT  Patient Details Name: Derek [name] MRN: [id number] DOB: May 28, 1976 Today's Date: 08/21/2015  Patient Stressors: Death (Aunt, friend, dog, and cat passed away recently)  Coping Skills:   Isolate, Music, Sports, Other (Comment) [employer](Church)  Personal Challenges: Concentration, Decision-Making, Expressing Yourself, Problem-Solving, Self-Esteem/Confidence, Stress Management, Time Management, Trusting Others  Leisure Interests (2+):  Individual - TV, Music - Listen  Awareness of Community Resources:  Yes  Community Resources:   Could not think of any of the names  Current Use: No  If no, Barriers?: Social (Doesn't like leaving his house)  Patient Strengths:  Personality, ability to get along with others  Patient Identified Areas of Improvement:  Not that he can think of  Current Recreation Participation:  TV, music, play with son  Patient Goal for Hospitalization:  To get out and take care of family  [place]  County of Residence:  [place]   Current SI (including self-harm):  No  Current HI:  No  Consent to Intern Participation: N/A   Derek [name],Derek [name], LRT/CTRS 08/21/2015, 1:15 PM

## 2015-08-22 LAB — GLUCOSE, CAPILLARY: Glucose-Capillary: 161 mg/dL — ABNORMAL HIGH (ref 65–99)

## 2015-08-22 MED ORDER — DULOXETINE HCL 20 MG PO CPEP: 100.0000 mg | ORAL_CAPSULE | Freq: Every day | ORAL | Status: AC

## 2015-08-22 MED ORDER — DIAZEPAM 10 MG PO TABS: 10.0000 mg | ORAL_TABLET | Freq: Three times a day (TID) | ORAL | Status: AC

## 2015-08-22 NOTE — Progress Notes (Signed)
D: Observed pt socializing in day room. Patient alert and oriented x4. Patient denies SI/HI/AVH. Pt affect is sad. Pt rates depression and anxiety at 0. 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  A: Offered active listening and support. Provided therapeutic communication. Administered scheduled medications. Gave oxycodone prn for pain. Set-up CPAP machine for pt and placed camera in room to be monitored at nursing station.  R: pt compliant with medication regimen. Pt calm and cooperative.  Will continue Q15 min. Checks. Safety maintained.

## 2015-08-22 NOTE — Progress Notes (Signed)
Recreation Therapy Notes  INPATIENT RECREATION TR PLAN  Patient Details Name: KHAMERON GRUENWALD MRN: [id number] DOB: [birth year] Today's Date: 08/22/2015  Rec Therapy Plan Is patient appropriate for Therapeutic Recreation?: Yes Treatment times per week: At least once a week TR Treatment/Interventions: 1:1 session, Group participation (Comment) (Appropriate participation in daily recreation therapy tx)  Discharge Criteria Pt will be discharged from therapy if:: Treatment goals are met, Discharged Treatment plan/goals/alternatives discussed and agreed upon by:: Patient/family  Discharge Summary Short term goals set: See Care Plan Short term goals met: Complete Progress toward goals comments: One-to-one attended Which groups?: Other (Comment) (Self-expression) One-to-one attended: Self-esteem, stress management Reason goals not met: N/A Therapeutic equipment acquired: None Reason patient discharged from therapy: Discharge from hospital Pt/family agrees with progress & goals achieved: Yes Date patient discharged from therapy: 08/22/15   Leonette Monarch, LRT/CTRS 08/22/2015, 4:52 PM

## 2015-08-22 NOTE — Plan of Care (Signed)
Problem: [facility] Participation in Recreation Therapeutic Interventions Goal: STG-Patient will demonstrate improved self esteem by identif STG: Self-Esteem - Within 3 treatment sessions, patient will verbalize at least 5 positive affirmation statements in one treatment session to increase self-esteem post d/c.  Outcome: Completed/Met Date Met:  08/22/15 Treatment Session 1; Completed 1 out of 1: At approximately 2:15 pm, LRT met with patient in patient room. Patient verbalized 5 positive affirmation statements. Patient stated, "I like that a lot". LRT encouraged patient to continue saying positive affirmation statements. Intervention Used: I Am statements  Leonette Monarch, LRT/CTRS 12.14.16 2:39 pm Goal: STG-Other Recreation Therapy Goal (Specify) STG: Stress Management - Within 3 treatment sessions, patient will verbalize understanding of the stress management techniques in one treatment session to increase stress management skills post d/c.  Outcome: Completed/Met Date Met:  08/22/15 Treatment Session 1; Completed 1 out of 1: At approximately 2:15 pm, LRT met with patient in patient room. LRT educated and provided patient with handouts on stress management techniques. Patient verbalized understanding. LRT encouraged patient to read over and practice the stress management techniques. Intervention Used: Stress Management handouts  Leonette Monarch, LRT/CTRS 12.14.16 2:40 pm

## 2015-08-22 NOTE — Discharge Summary (Addendum)
Physician Discharge Summary Note  Patient:  Derek [name] is an 39 y.o., male MRN:  [id number] DOB:  11/08/1975 Patient phone:  [phone] (home)  Patient address:   [place] [address],  Total Time spent with patient: 30 minutes  Date of Admission:  08/20/2015 Date of Discharge: 08/23/15  Reason for Admission:  SI  Principal Problem: Major depressive disorder, recurrent episode, severe [facility]) Discharge Diagnoses: Patient Active Problem List   Diagnosis Date Noted  . Diabetes ([place]) [E11.9] 08/20/2015  . HTN (hypertension) [I10] 08/20/2015  . OSA (obstructive sleep apnea) [G47.33] 08/20/2015  . Diabetic neuropathy (Derek [name]) [E11.40] 08/20/2015  . Dyslipidemia [E78.5] 08/20/2015  . Coronary artery disease [I25.10] 08/20/2015  . Tobacco use disorder [F17.200] 08/20/2015  . Major depressive disorder, recurrent episode, severe (Derek [name]) [F33.2] 08/20/2015  . MDD (major depressive disorder), recurrent episode, severe (Derek [name]) [F33.2] 08/20/2015   History of Present Illness:  Derek [name] is a 39 y.o. male who was brought to our ER on 78/46 via local police for evaluation. The involuntary commitment paperwork was filled out by the Police Department after they had difficulty getting the patient to voluntarily come out of the household. Patient reported that he was no suicidal; he was having an argument with his wife and put a gun to his chin. He stated he had no intention of harming himself he just wanted to "" stop the argument "".   The wife reported ([phone]) that the patient had posted on Facebook (" I almost had to call 911 or myself") and wrote stamen on his journal that indicated he has been suicidal l for at least the past week. Patient states that his friend has removed all guns from the house.  Respondent is diagnosed with depression; doctor has taken him off of his medication. Respondent took a gun, held it up to his throat, and asked his wife  "would she be better off if I did this?", implying he was going to shoot himself. Respondent then told his wife if she called someone for help, it would get bad. Law enforcement went to the home and called the respondent, tried to talk him to come out of the house. Total call lasted 3 hours. Respondent finally came out on his own.   Per wife Event organiser removed his guns from the home.Patient's best friend believes ALL the guns have been removed.  Pt reports hx of depression "my whole life" and alcoholism (last use 2 years ago). Patient currently suffers from chronic pain as a result of having neuropathy. The patient has been disabled and has been out of work for the last 3 years. The patient feels useless as he is hard for him to keep up with the housework, spend time with his wife or play with his son. Patient has been working with a counselor Engineer, petroleum Sprouse [phone].) and recently started seeing a psychologist for therapy. Patient has been receiving antidepressants by his primary care provider. He was treated with vrintelix which was discontinued due to causing serotonin syndrome. The patient was then treated with Cymbalta however the patient and his wife reported that Cymbalta has caused him any side effects such as dizziness and falls. The patient has become part of a support group in Facebook of patients that had developed side effects from Cymbalta as well. Patient's wife has been slowly tapering off the Cymbalta. Initially the patient was on 120 mg, the dose has been slowly decreased to 100 mg. Neither the patient's wife  or the patient are interested in any pharmacological interventions.   Urine toxicology was positive for benzodiazepines and cannabis. His alcohol level was below the detection limit. AST and ALT are within the normal limits. Patient says he smokes marijuana about twice a week to address his neuropathy. He is states that he suffered from significant issues with alcoholism  but has been sober for the last 2 years. He denies ever attending any kind of treatment for substance abuse.  Patient smokes 2 packs of cigarettes a day.  Associated Signs/Symptoms: Depression Symptoms: depressed mood, recurrent thoughts of death, (Hypo) Manic Symptoms: none Anxiety Symptoms: none Psychotic Symptoms: negative PTSD Symptoms: Negative Total Time spent with patient: 1 hour  Past Psychiatric History: Patient is currently seen a therapist,Patty Sprouse-[phone]. and recently started seeing a psychologist. Patient denies prior psychiatric hospitalizations. He denies any history of self injury. He says that at the age of 39 he attempted to hurt himself he was found by his father holding a knife against his arm. His wife however reports that she believes prior to them getting married he had issues with cuting in his early 39s.   Patient is currently taking Cymbalta 100 mg prescribed by his primary care provider and he is also on Valium 10 mg 4 times a day. Patient stated that he was switched from Xanax to Valium.    Past Medical History: Patient suffers from diabetes type 2, diabetic neuropathy, chronic pain, migraines, hypertension, dyslipidemia, obstructive sleep apnea, per records he has history of pseudoseizures for which he was recently seen at [facility]. He had an EEG negative. Patient sees a cardiologist at [facility] as well for cardiovascular disease patient had a mediocre for patient and to his stent placed. Past Medical History  Diagnosis Date  . Diabetes mellitus without complication (Derek [name])   . Chronic pain   . Coronary artery disease   . Diabetic neuropathy (Derek [name])   . Hypertension   . Hypercholesteremia    History reviewed. No pertinent past surgical history.  Family History: History reviewed. No pertinent family history.  Family Psychiatric History: Patient reported that his mother and brother suffer from depression. He reports that multiple  relatives on his mother's side are alcoholics. He denies any history of suicide in his family. The patient's wife is states that the patient's mother recently attempted suicide about 6 months ago she overdosed on Plavix and ended up having a stroke. She also reports that his 2 brothers have attempted suicide  Social History: Patient currently lives in [place] with his wife of 9 years and their 84-year-old son. The patient used to work in [employer] but due to his medical problems he has been out of work for the last 3 years and has been applying for disability. His wife works as a Chief Executive Officer. They moved from [place] to [place] about 6 years ago.   Patient denies ever being in the [place]  Patient denies any legal history History  Alcohol Use No    History  Drug Use No    Social History   Social History  . Marital Status: Married    Spouse Name: N/A  . Number of Children: N/A  . Years of Education: N/A   Social History Main Topics  . Smoking status: Current Every Day Smoker  . Smokeless tobacco: None  . Alcohol Use: No  . Drug Use: No  . Sexual Activity: Not Asked   Other Topics Concern  . None   Social History  Narrative    Allergies: No Known Allergies         Hospital Course:   39 year old Caucasian male with history of depression, chronic pain and multiple medical problems who presents to the emergency department after pointing a gun at his head during an argument with his wife.  Major depressive disorder: The patient has been treated with Cymbalta 100 mg by mouth daily by his primary care provider. The patient did not wish to start any medications to address his depression. He says his wife has been tapering off the Cymbalta slowly since the summer and they do not want the Cymbalta dose change at this time. Wife also reported not wanting him on many new medications.   The family  was advised to maintain the dose of Cymbalta at 100 mg and not to continue with the taper as patient apparently had worsened once the dose was decreased from 120 mg to 100 mg  Chronic pain: The patient will be continued on Roxicodone 30 mg q 4 h (max 4 doses) as needed along with Valium 10 mg 3 times a day.  The Valium was decreased from 10 mg every 6 hours to Valium 10 mg every 8 hours.  No prescription was given to the patient as this medication is prescribed to him by his primary care doctor.  Diabetes: Continue metformin, Invokanna and  long acting insulin in the morning. Hemoglobin A1c was 6.7  Hypertension: Patient will be continued on losartan, Imdur and verapamil.  Cardiovascular disease: continue Plavix 75 mg a day and aspirin 81 mg a day.  Dyslipidemia: continue Crestor 20 mg a day  Tobacco use disorder: Patient received a nicotine patch 21 mg   Collateral information has been obtained from the patient's wife and his therapist.  His therapist agrees that the patient will benefit from antidepressant. Was explained to the therapist that the family was not in agreement with any changes on the patient's medications as they reported patient had failed multiple trials with different antidepressants.  Labs: TSH is a slowly decreased.  Will have patient to follow-up with his primary care provider  Discharge disposition: Patient will return to his house today. I contacted the patient's wife and she does not have any concerns about his safety. In a prior conversation she had confirmed that the patient's best friend remove all weapons from the home.  Discharge follow-up: We'll continue to follow-up with outpatient services the management of depression and chronic pain.  During this hospitalization the patient was calm, pleasant and cooperative. She participated in programming actively. He had appropriate interactions with peers and staff. He was calm and respected to staff. He was compliant  with medications. There was no need for seclusion, restraints or forced medications. Patient displays some borderline traits.  He stated that he had pointed a gun to himself in order to get attention from his wife. He also made comments on Facebook that appeared to be attention seeking.  During his stay in the hospital the patient did attempted to split the staff a couple times.  Per the patient's wife the issues with suicidality are not new and the patient has had issues with chronic suicidality.  On the day of the discharge the patient reported feeling significantly improved. He felt that the group therapy was significantly beneficial to him. He said that he had learned a lot from interacting with peers that were going through similar situations.  Patient denied thoughts of suicide homicide. He denied auditory or visual hallucinations.  He denied feelings for hopelessness or helplessness.  He denied side effects from medications or major physical complaints other than issues with chronic pain.     Musculoskeletal: Strength & Muscle Tone: within normal limits Gait & Station: normal Patient leans: N/A  Psychiatric Specialty Exam: Review of Systems  Constitutional: Negative.   HENT: Negative.   Eyes: Negative.   Respiratory: Negative.   Cardiovascular: Negative.   Gastrointestinal: Negative.   Genitourinary: Negative.   Musculoskeletal: Positive for back pain.  Skin: Negative.   Neurological: Negative.   Endo/Heme/Allergies: Negative.   Psychiatric/Behavioral: Negative for depression, suicidal ideas, hallucinations, memory loss and substance abuse. The patient is not nervous/anxious and does not have insomnia.     Blood pressure 107/62, pulse 72, temperature 97.8 F (36.6 C), temperature source Oral, resp. rate 18, height 6' (1.829 m), weight 102.059 kg (225 lb), SpO2 98 %.Body mass index is 30.51 kg/(m^2).  General Appearance: Well Groomed  Engineer, water::  Good  Speech:  Clear and  Coherent  Volume:  Normal  Mood:  Euthymic  Affect:  Congruent  Thought Process:  Linear  Orientation:  Full (Time, Place, and Person)  Thought Content:  Hallucinations: None  Suicidal Thoughts:  No  Homicidal Thoughts:  No  Memory:  Immediate;   Fair Recent;   Fair Remote;   Good  Judgement:  Fair  Insight:  Fair  Psychomotor Activity:  Normal  Concentration:  Fair  Recall:  [place] of Knowledge:Good  Language: Good  Akathisia:  No  Handed:    AIMS (if indicated):     Assets:  Communication Skills Desire for Improvement Housing Social Support Vocational/Educational  ADL's:  Intact  Cognition: WNL  Sleep:  Number of Hours: 1.61   Metabolic Disorder Labs:  Lab Results  Component Value Date   HGBA1C 6.7* 08/21/2015   No results found for: PROLACTIN No results found for: CHOL, TRIG, HDL, CHOLHDL, VLDL, LDLCALC  Results for JEROMIE, GAINOR (MRN [id number]) as of 08/22/2015 12:39  Ref. Range 04/15/2015 11:09 08/18/2015 17:31 08/18/2015 17:32  Sodium Latest Ref Range: 135-145 mmol/L  136   Potassium Latest Ref Range: 3.5-5.1 mmol/L  3.9   Chloride Latest Ref Range: 101-111 mmol/L  99 (L)   CO2 Latest Ref Range: 22-32 mmol/L  26   BUN Latest Ref Range: 6-20 mg/dL  8   Creatinine Latest Ref Range: 0.61-1.24 mg/dL  0.70   Calcium Latest Ref Range: 8.9-10.3 mg/dL  9.8   EGFR (Non-African Amer.) Latest Ref Range: >60 mL/min  >60   EGFR (African American) Latest Ref Range: >60 mL/min  >60   Glucose Latest Ref Range: 65-99 mg/dL  147 (H)   Anion gap Latest Ref Range: 5-15   11   Alkaline Phosphatase Latest Ref Range: 38-126 U/L  74   Albumin Latest Ref Range: 3.5-5.0 g/dL  4.7   AST Latest Ref Range: 15-41 U/L  23   ALT Latest Ref Range: 17-63 U/L  29   Total Protein Latest Ref Range: 6.5-8.1 g/dL  8.8 (H)   Total Bilirubin Latest Ref Range: 0.3-1.2 mg/dL  0.6   WBC Latest Ref Range: 3.8-10.6 K/uL  18.8 (H)   RBC Latest Ref Range: 4.40-5.90 MIL/uL  5.20   Hemoglobin  Latest Ref Range: 13.0-18.0 g/dL  15.9   HCT Latest Ref Range: 40.0-52.0 %  46.8   MCV Latest Ref Range: 80.0-100.0 fL  89.9   MCH Latest Ref Range: 26.0-34.0 pg  30.6   MCHC Latest Ref  Range: 32.0-36.0 g/dL  34.0   RDW Latest Ref Range: 11.5-14.5 %  13.4   Platelets Latest Ref Range: 150-440 K/uL  403   Neutrophils Latest Units: %  77   Lymphocytes Latest Units: %  16   Monocytes Relative Latest Units: %  4   Eosinophil Latest Units: %  1   Basophil Latest Units: %  2   NEUT# Latest Ref Range: 1.4-6.5 K/uL  14.6 (H)   Lymphocyte # Latest Ref Range: 1.0-3.6 K/uL  3.0   Monocyte # Latest Ref Range: 0.2-1.0 K/uL  0.8   Eosinophils Absolute Latest Ref Range: 0-0.7 K/uL  0.1   Basophils Absolute Latest Ref Range: 0-0.1 K/uL  0.3 (H)   Appearance Latest Ref Range: CLEAR    CLEAR (A)  Bacteria, UA Latest Ref Range: NONE SEEN    NONE SEEN  Bilirubin Urine Latest Ref Range: NEGATIVE    NEGATIVE  Color, Urine Latest Ref Range: YELLOW    STRAW (A)  Glucose Latest Ref Range: NEGATIVE mg/dL   >500 (A)  Hgb urine dipstick Latest Ref Range: NEGATIVE    NEGATIVE  Ketones, ur Latest Ref Range: NEGATIVE mg/dL   NEGATIVE  Leukocytes, UA Latest Ref Range: NEGATIVE    NEGATIVE  Nitrite Latest Ref Range: NEGATIVE    NEGATIVE  pH Latest Ref Range: 5.0-8.0    6.0  Protein Latest Ref Range: NEGATIVE mg/dL   NEGATIVE  RBC / HPF Latest Ref Range: 0-5 RBC/hpf   NONE SEEN  Specific Gravity, Urine Latest Ref Range: 1.005-1.030    1.002 (L)  Squamous Epithelial / LPF Latest Ref Range: NONE SEEN    NONE SEEN  WBC, UA Latest Ref Range: 0-5 WBC/hpf   NONE SEEN  Alcohol, Ethyl (B) Latest Ref Range: <5 mg/dL  <5   Amphetamines, Ur Screen Latest Ref Range: NONE DETECTED    NONE DETECTED  Barbiturates, Ur Screen Latest Ref Range: NONE DETECTED    NONE DETECTED  Benzodiazepine, Ur Scrn Latest Ref Range: NONE DETECTED    POSITIVE (A)  Cocaine Metabolite,Ur McCartys [name] Latest Ref Range: NONE DETECTED    NONE DETECTED  Methadone  Scn, Ur Latest Ref Range: NONE DETECTED    NONE DETECTED  MDMA (Ecstasy)Ur Screen Latest Ref Range: NONE DETECTED    NONE DETECTED  Cannabinoid 50 Ng, Ur [place] Latest Ref Range: NONE DETECTED    POSITIVE (A)  Opiate, Ur Screen Latest Ref Range: NONE DETECTED    NONE DETECTED  Phencyclidine (PCP) Ur S Latest Ref Range: NONE DETECTED    NONE DETECTED  Tricyclic, Ur Screen Latest Ref Range: NONE DETECTED    NONE DETECTED  EKG Unknown Attch      Discharge Instructions    Diet - low sodium heart healthy    Complete by:  As directed      Diet Carb Modified    Complete by:  As directed             Medication List    STOP taking these medications        doxycycline 100 MG capsule  Commonly known as:  VIBRAMYCIN      TAKE these medications      Indication   ASPIR-81 81 MG EC tablet  Generic drug:  aspirin  Take 81 mg by mouth daily.  Notes to Patient:  Cardiovascular health      clopidogrel 75 MG tablet  Commonly known as:  PLAVIX  Take 75 mg by mouth daily.  Notes  to Patient:  Cardiovascular health      CRESTOR 20 MG tablet  Generic drug:  rosuvastatin  Take 20 mg by mouth daily.  Notes to Patient:  Cholesterol      diazepam 10 MG tablet  Commonly known as:  VALIUM  Take 1 tablet (10 mg total) by mouth 3 (three) times daily.  Notes to Patient:  Anxiety      DULoxetine 20 MG capsule  Commonly known as:  CYMBALTA  Take 5 capsules (100 mg total) by mouth daily.  Notes to Patient:  Depression and chronic pain      gabapentin 600 MG tablet  Commonly known as:  NEURONTIN  Take 600 mg by mouth 2 (two) times daily.  Notes to Patient:  Neuropathic pain      INVOKAMET [id number] MG Tabs  Generic drug:  Canagliflozin-Metformin HCl  Take 1 tablet by mouth 2 (two) times daily.  Notes to Patient:  Diabetes type 2      isosorbide mononitrate 60 MG 24 hr tablet  Commonly known as:  IMDUR  Take 60 mg by mouth daily.  Notes to Patient:  Blood pressure      losartan 50 MG tablet   Commonly known as:  COZAAR  Take 50 mg by mouth daily.  Notes to Patient:  Blood pressure      oxycodone 30 MG immediate release tablet  Commonly known as:  ROXICODONE  Take 30 mg by mouth 4 (four) times daily. For breakthrough pain.  Notes to Patient:  Chronic pain      ranitidine 300 MG tablet  Commonly known as:  ZANTAC  Take 300 mg by mouth daily.  Notes to Patient:  GERD      TRESIBA FLEXTOUCH 100 UNIT/ML Sopn  Generic drug:  Insulin Degludec  Inject 20 Units into the skin daily.  Notes to Patient:  Diabetes type 2      verapamil 360 MG 24 hr capsule  Commonly known as:  VERELAN PM  Take 360 mg by mouth daily.  Notes to Patient:  Blood pressure            Follow-up Information    Follow up with Dr. Renata Caprice, Ph. D. Go on 08/28/2015.   Why:  For follow-up care appt Tuesday 08/28/15 at 2:00pm   Contact information:   [address] [place], [place] [address] Ph [phone] Fax [phone]     >30 minutes  Signed: Hildred Priest 08/22/2015, 12:38 PM

## 2015-08-22 NOTE — Progress Notes (Signed)
D:Patient aware of discharge this shift . Patient returning home . Patient received all belonging locked up . Patient denies  Suicidal  And homicidal ideations  .  A: Writer instructed on discharge criteria  . No prescriptions  given to patient . Aware  Of follow up appointment . R: Patient left unit with no questions  Or concerns  With wife

## 2015-08-22 NOTE — Plan of Care (Signed)
Problem: Alteration in mood Goal: LTG-Patient reports reduction in suicidal thoughts (Patient reports reduction in suicidal thoughts and is able to verbalize a safety plan for whenever patient is feeling suicidal)  Outcome: Progressing Pt denies SI     

## 2015-08-22 NOTE — Progress Notes (Signed)
  [facility]  Will you be returning to the same living situation after discharge:  Yes,  patient will discharge home with wife At discharge, do you have transportation home?: Yes,  patient's wife will pick up Do you have the ability to pay for your medications: Yes,  patient has insurance  Release of information consent forms completed and in the chart;  Patient's signature needed at discharge.  Patient to Follow up at: Follow-up Information    Follow up with Dr. Steele Sizerharles Burnett, Ph. D. Go on 08/28/2015.   Why:  For follow-up care appt Tuesday 08/28/15 at 2:00pm   Contact information:   [address] [place], [place] [address] Ph [phone] Fax [phone]      [facility] [place]. Go on 08/24/2015.   Why:  For follow-up care appt for therapy Friday 08/24/15 at 1:00pm   Contact information:   [address] [place], [place] [address] Ph [phone]       Next level of care provider [facility] Link:no  Patient denies SI/HI: Yes,  patient denies SI/HI    Safety Planning and Suicide Prevention discussed: Yes,  SPE disucssed with patient and Vickey SagesSheree Bachus (wife) [phone]   Have you used any form of tobacco in the last 30 days? (Cigarettes, Smokeless Tobacco, Cigars, and/or Pipes): Yes  Has patient been referred to the Quitline?: Patient refused referral  Patient has been referred for addiction treatment: N/A  Lulu RidingIngle, Keosha Rossa T, MSW, LCSWA 08/22/2015, 3:03 PM

## 2015-08-22 NOTE — [facility] Group Notes (Signed)
[facility] LCSW Group Therapy   08/22/2015 1:15 PM   Type of Therapy: Group Therapy   Participation Level: Active   Participation Quality: Attentive, Sharing and Supportive   Affect: Depressed and Flat   Cognitive: Alert and Oriented   Insight: Developing/Improving and Engaged   Engagement in Therapy: Developing/Improving and Engaged   Modes of Intervention: Clarification, Confrontation, Discussion, Education, Exploration, Limit-setting, Orientation, Problem-solving, Rapport Building, Dance movement psychotherapisteality Testing, Socialization and Support   Summary of Progress/Problems: The topic for group today was emotional regulation. This group focused on both positive and negative emotion identification and allowed  group members to process ways to identify feelings, regulate negative emotions, and find healthy ways to manage internal/external emotions. Group members were asked to reflect on a time when their reaction to an emotion led to a negative outcome and explored how alternative responses using emotion regulation would have benefited them. Group members were also asked to discuss a time when emotion regulation was utilized when a negative emotion was experienced. Pt shared with the group that in the past the pt had regulated his emotions with alcohol, but has since become abstinent and questioned other group members about coping mechanisms they use to regulate their emotions.  Pt shared he regulates his emotions by participating in group therapy.    Dorothe PeaJonathan F. Dorine Duffey, MSW, LCSWA, LCAS

## 2015-08-22 NOTE — [facility] Group Notes (Signed)
[facility] Group Notes:  (Nursing/MHT/Case Management/Adjunct)  Date:  08/22/2015  Time:  12:09 PM  Type of Therapy:  Psychoeducational Skills  Participation Level:  Active  Participation Quality:  Appropriate, Attentive and Sharing  Affect:  Appropriate  Cognitive:  Alert and Appropriate  Insight:  Appropriate  Engagement in Group:  Engaged  Modes of Intervention:  Discussion, Education and Support  Summary of Progress/Problems:  Derek [name] 08/22/2015, 12:09 PM

## 2015-08-22 NOTE — B[facility] Suicide Risk Assessment (Signed)
[facility]   Demographic Factors:  Male, Caucasian and Unemployed  Total Time spent with patient: 30 minutes   Psychiatric Specialty Exam: Physical Exam  ROS                                                         Have you used any form of tobacco in the last 30 days? (Cigarettes, Smokeless Tobacco, Cigars, and/or Pipes): Yes  Has this patient used any form of tobacco in the last 30 days? (Cigarettes, Smokeless Tobacco, Cigars, and/or Pipes) Yes, A prescription for an FDA-approved tobacco cessation medication was offered at discharge and the patient refused  Mental Status Per Nursing Assessment::   On Admission:  NA  Current Mental Status by Physician: Calm, pleasant, cooperative. No longer feeling hopeless, or helpless. Patient has been attending groups and participating actively. He denies any thoughts of suicide and homicide, there is no evidence of psychosis, mania or hypomania..  Guns have been removed from the home. Spoke with patient's wife who has no concerns about his safety  Loss Factors: Decline in physical health  Historical Factors: Family history of mental illness or substance abuse and Impulsivity  Risk Reduction Factors:   Responsible for children under 37 years of age, Sense of responsibility to family, Religious beliefs about death, Living with another person, especially a relative and Positive social support  Continued Clinical Symptoms:  Depression:   Impulsivity Chronic Pain  Cognitive Features That Contribute To Risk:  None    Suicide Risk:  Minimal: No identifiable suicidal ideation.  Patients presenting with no risk factors but with morbid ruminations; may be classified as minimal risk based on the severity of the depressive symptoms  Principal Problem: Major depressive disorder, recurrent episode, severe [facility](HCC) Discharge Diagnoses:  Patient Active Problem List   Diagnosis Date Noted  . Diabetes  (HCC) [E11.9] 08/20/2015  . HTN (hypertension) [I10] 08/20/2015  . OSA (obstructive sleep apnea) [G47.33] 08/20/2015  . Diabetic neuropathy (HCC) [E11.40] 08/20/2015  . Dyslipidemia [E78.5] 08/20/2015  . Coronary artery disease [I25.10] 08/20/2015  . Tobacco use disorder [F17.200] 08/20/2015  . Major depressive disorder, recurrent episode, severe (HCC) [F33.2] 08/20/2015  . MDD (major depressive disorder), recurrent episode, severe (HCC) [F33.2] 08/20/2015    Follow-up Information    Follow up with Dr. Steele Sizerharles Burnett, Ph. D. Go on 08/28/2015.   Why:  For follow-up care appt Tuesday 08/28/15 at 2:00pm   Contact information:   [address] [place], [place] [address] Ph [phone] Fax [phone]      Is patient on multiple antipsychotic therapies at discharge:  No   Has Patient had three or more failed trials of antipsychotic monotherapy by history:  No  Recommended Plan for Multiple Antipsychotic Therapies: NA    Jimmy FootmanHernandez-Gonzalez,  Samul Mcinroy 08/22/2015, 11:58 AM

## 2015-08-22 NOTE — Plan of Care (Signed)
Problem: Alteration in mood Goal: LTG-Patient reports reduction in suicidal thoughts (Patient reports reduction in suicidal thoughts and is able to verbalize a safety plan for whenever patient is feeling suicidal)  Outcome: Progressing Denies suicidal and homicidal ideations

## 2015-08-22 NOTE — [facility] Group Notes (Signed)
[facility]   08/22/2015 9:15 AM  ?  Participation Quality: Alert, Appropriate and Oriented   Mood/Affect: Pleasant and calm  Depression Rating: 0  Anxiety Rating: 0  Thoughts of Suicide: Pt denies SI/HI   Will you contract for safety? Yes   Current AVH: Pt denies   Plan for Discharge/Comments: Pt attended discharge planning group and actively participated in group. CSW provided pt with today's workbook. Pt discussed his looking forward to returning home and being a better father and a better husband.  Pt reports he is enjoying group and enjoys the support of his fellow group members.  Transportation Means: Pt reports access to transportation   Supports: Pt reports his family is his primary support    Dorothe PeaJonathan F. Willim [name], MSW, LCSWA, LCAS

## 2015-08-24 LAB — THYROID PANEL WITH TSH
FREE THYROXINE INDEX: 2.3 (ref 1.2–4.9)
T3 Uptake Ratio: 26 % (ref 24–39)
T4 TOTAL: 8.7 ug/dL (ref 4.5–12.0)
TSH: 0.222 u[IU]/mL — AB (ref 0.450–4.500)

## 2016-12-04 ENCOUNTER — Ambulatory Visit
Admission: RE | Admit: 2016-12-04 | Discharge: 2016-12-04 | Disposition: A | Discharge status: Home / Self Care | Payer: BLUE CROSS/BLUE SHIELD | Source: Ambulatory Visit | Attending: Family Medicine | Admitting: Family Medicine

## 2016-12-04 ENCOUNTER — Other Ambulatory Visit: Payer: Self-pay | Admitting: Family Medicine

## 2016-12-04 DIAGNOSIS — M79672 Pain in left foot: Secondary | ICD-10-CM

## 2016-12-04 DIAGNOSIS — M79671 Pain in right foot: Secondary | ICD-10-CM | POA: Diagnosis present

## 2016-12-09 ENCOUNTER — Other Ambulatory Visit: Payer: Self-pay | Admitting: Family Medicine

## 2016-12-09 DIAGNOSIS — R748 Abnormal levels of other serum enzymes: Secondary | ICD-10-CM

## 2016-12-15 ENCOUNTER — Ambulatory Visit
Admission: RE | Admit: 2016-12-15 | Discharge: 2016-12-15 | Disposition: A | Discharge status: Home / Self Care | Payer: BLUE CROSS/BLUE SHIELD | Source: Ambulatory Visit | Attending: Family Medicine | Admitting: Family Medicine

## 2016-12-15 DIAGNOSIS — R748 Abnormal levels of other serum enzymes: Secondary | ICD-10-CM | POA: Diagnosis present

## 2016-12-15 DIAGNOSIS — K760 Fatty (change of) liver, not elsewhere classified: Secondary | ICD-10-CM | POA: Diagnosis not present

## 2017-01-14 ENCOUNTER — Other Ambulatory Visit: Payer: Self-pay | Admitting: Family Medicine

## 2017-01-14 DIAGNOSIS — R748 Abnormal levels of other serum enzymes: Secondary | ICD-10-CM

## 2017-01-19 ENCOUNTER — Ambulatory Visit
Admission: RE | Admit: 2017-01-19 | Discharge: 2017-01-19 | Disposition: A | Discharge status: Home / Self Care | Payer: BLUE CROSS/BLUE SHIELD | Source: Ambulatory Visit | Attending: Family Medicine | Admitting: Family Medicine

## 2017-01-19 DIAGNOSIS — R748 Abnormal levels of other serum enzymes: Secondary | ICD-10-CM

## 2018-03-30 ENCOUNTER — Encounter: Admit: 2018-03-30 | Discharge: 2018-03-31 | Payer: MEDICAID | Attending: Internal Medicine | Primary: Internal Medicine

## 2018-03-30 DIAGNOSIS — I2510 Atherosclerotic heart disease of native coronary artery without angina pectoris: Principal | ICD-10-CM

## 2018-03-30 DIAGNOSIS — I25118 Atherosclerotic heart disease of native coronary artery with other forms of angina pectoris: Secondary | ICD-10-CM

## 2018-04-16 ENCOUNTER — Encounter: Admit: 2018-04-16 | Discharge: 2018-04-16 | Payer: MEDICAID

## 2018-05-27 ENCOUNTER — Encounter: Admit: 2018-05-27 | Discharge: 2018-05-28 | Payer: MEDICAID | Attending: Adult Health | Primary: Adult Health

## 2018-05-27 DIAGNOSIS — E785 Hyperlipidemia, unspecified: Secondary | ICD-10-CM

## 2018-05-27 DIAGNOSIS — Z955 Presence of coronary angioplasty implant and graft: Secondary | ICD-10-CM

## 2018-05-27 DIAGNOSIS — Z789 Other specified health status: Secondary | ICD-10-CM

## 2018-05-27 DIAGNOSIS — F17200 Nicotine dependence, unspecified, uncomplicated: Secondary | ICD-10-CM

## 2018-05-27 DIAGNOSIS — I25118 Atherosclerotic heart disease of native coronary artery with other forms of angina pectoris: Principal | ICD-10-CM

## 2018-05-27 DIAGNOSIS — I10 Essential (primary) hypertension: Secondary | ICD-10-CM

## 2018-05-27 MED ORDER — VERAPAMIL ER 360 MG 24 HR CAPSULE,EXTENDED RELEASE
ORAL_CAPSULE | Freq: Every day | ORAL | 5 refills | 0 days
Start: 2018-05-27 — End: 2021-02-02

## 2020-11-19 ENCOUNTER — Encounter: Admit: 2020-11-19 | Discharge: 2020-11-21 | Payer: MEDICAID

## 2020-11-19 ENCOUNTER — Ambulatory Visit: Admit: 2020-11-19 | Discharge: 2020-11-21 | Payer: MEDICARE

## 2020-11-28 ENCOUNTER — Ambulatory Visit: Admit: 2020-11-28 | Discharge: 2020-11-29 | Attending: Adult Health | Primary: Adult Health

## 2020-11-28 DIAGNOSIS — E785 Hyperlipidemia, unspecified: Principal | ICD-10-CM

## 2020-11-28 DIAGNOSIS — I10 Essential (primary) hypertension: Principal | ICD-10-CM

## 2020-11-28 DIAGNOSIS — I25118 Atherosclerotic heart disease of native coronary artery with other forms of angina pectoris: Principal | ICD-10-CM

## 2020-11-28 DIAGNOSIS — F4310 Post-traumatic stress disorder, unspecified: Principal | ICD-10-CM

## 2020-11-28 DIAGNOSIS — Z789 Other specified health status: Principal | ICD-10-CM

## 2020-11-28 MED ORDER — METOPROLOL SUCCINATE ER 25 MG TABLET,EXTENDED RELEASE 24 HR
ORAL_TABLET | Freq: Two times a day (BID) | ORAL | 3 refills | 90.00000 days | Status: CP
Start: 2020-11-28 — End: ?

## 2020-11-28 MED ORDER — NITROGLYCERIN 0.4 MG SUBLINGUAL TABLET
ORAL_TABLET | SUBLINGUAL | 0 refills | 1 days | Status: CP | PRN
Start: 2020-11-28 — End: ?

## 2020-11-28 MED ORDER — REPATHA SURECLICK 140 MG/ML SUBCUTANEOUS PEN INJECTOR
SUBCUTANEOUS | 5 refills | 0.00000 days | Status: CP
Start: 2020-11-28 — End: ?

## 2020-11-29 DIAGNOSIS — I25118 Atherosclerotic heart disease of native coronary artery with other forms of angina pectoris: Principal | ICD-10-CM

## 2020-11-29 DIAGNOSIS — Z789 Other specified health status: Principal | ICD-10-CM

## 2020-11-29 MED ORDER — PRALUENT PEN 75 MG/ML SUBCUTANEOUS PEN INJECTOR
SUBCUTANEOUS | 5 refills | 0 days | Status: CP
Start: 2020-11-29 — End: ?
  Filled 2020-12-12: qty 2, 28d supply, fill #0

## 2020-12-06 NOTE — Unmapped (Signed)
[facility] Specialty Medication Onboarding    Specialty Medication: Praluent Pen 75mg /mL Injection  Prior Authorization: Approved   Financial Assistance: No - copay  <$25  Final Copay/Day Supply: $9.85 / 28 days    Insurance Restrictions: None     Notes to Pharmacist: N/A    The triage team has completed the benefits investigation and has determined that the patient is able to fill this medication at [facility]. Please contact the patient to complete the onboarding or follow up with the prescribing physician as needed.

## 2020-12-07 NOTE — Unmapped (Unsigned)
[facility]   Patient Onboarding/Medication Counseling    [name] is a 45 y.o. male with CAD who I am counseling today on initiation of therapy.  I am speaking to the patient.    Was a Nurse, learning disability used for this call? No    Verified patient's date of birth / HIPAA.    Specialty medication(s) to be sent: General Specialty: Praluent      Non-specialty medications/supplies to be sent: Higher education careers adviser      Medications not needed at this time: n/a         Praluent (alirocumab)    Medication & Administration     Dosage: Inject the contents of 1 pen (75mg ) under the skin every 2 weeks.    Administration: Inject under the skin of the thigh, abdomen or upper arm. Rotate sites with each injection.  ??? Injection instructions - Pen:  o Take 1 (or 2) pens out of the refrigerator and allow to stand at room temperature for 30 to 40 minutes  o Check the pen(s) for the following:  - Expiration date  - Absence of damage or cracks  - Medication is clear, colorless or pale yellow and free from particles  o Choose your injection site and clean with alcohol wipe. Allow to air dry completely.  o Pull the blue needle cap straight off and discard  o Hold the pen in your palm with your thumb over the green button, but do not touch the green button yet.  o Press the pen unto your skin at a 90 degree angle until the yellow safety cover disappears  o Push the green button and immediately release. You will hear a ???click.??? This signifies that the injection has started.  o Continue to hold the pen in place, maintaining pressure, until the entire window has turned yellow.  - This may take up to 20 seconds.  - You may hear a second ???click??? when the injection is complete.  o Lift the pen straight off your skin and dispose of it in a sharps container  o If there is blood at the injection site gently press a cotton ball or guaze to the site. Do not rub the injection site.    Adherence/Missed dose instructions: Administer a missed dose within 7 days and resume your normal schedule. If it has been more than 7 days and you inject every 2 weeks, skip the missed dose and resume your normal schedule..     Goals of Therapy     Lower cholesterol  Lower the risk of heart attack, stroke and unstable angina in people with heart disease    Side Effects & Monitoring Parameters   ??? Injection site irritation  ??? Flu-like symptoms  ??? Nose or throat irritation    The following side effects should be reported to the provider:  ??? Signs of an allergic reaction    Contraindications, Warnings, & Precautions     ??? Hypersensitivity    Drug/Food Interactions     ??? Medication list reviewed in Epic. The patient was instructed to inform the care team before taking any new medications or supplements. No drug interactions identified.     Storage, Handling Precautions, & Disposal     ??? Praluent should be stored in the refrigerator.   o If necessary Praluent may be stored at room temperature, in the original carton, for no more than 30 days.  ??? Place used devices into a sharps container for disposal  Current Medications (including OTC/herbals), Comorbidities and Allergies     Current Outpatient Medications   Medication Sig Dispense Refill   ??? alirocumab (PRALUENT PEN) 75 mg/mL PnIj Inject the contents of 1 pen (75 mg) under the skin every fourteen (14) days. 2 mL 5   ??? ALPRAZolam (XANAX) 1 MG tablet Take 1 mg by mouth Three (3) times a day.     ??? amitriptyline (ELAVIL) 25 MG tablet Take 25 mg by mouth nightly.     ??? aspirin (ASPIR-81) 81 MG tablet Take 81 mg by mouth daily.      ??? BD INSULIN SYRINGE ULTRA-FINE 1 mL 31 gauge x 5/16 (8 mm) Syrg TAKE 1 MISCELLANEOUS THREE TIMES A DAY     ??? clopidogrel (PLAVIX) 75 mg tablet Take 75 mg by mouth daily.     ??? desvenlafaxine succinate (PRISTIQ) 100 MG 24 hr tablet Take 100 mg by mouth daily.     ??? diazePAM (VALIUM) 10 MG tablet Take 10 mg by mouth Three (3) times a day.      ??? esomeprazole (NEXIUM) 20 MG capsule Take 20 mg by mouth daily.     ??? isosorbide mononitrate (IMDUR) 60 MG 24 hr tablet Take 120 mg by mouth daily.      ??? lisinopriL (PRINIVIL,ZESTRIL) 2.5 MG tablet Take 2.5 mg by mouth daily.     ??? metFORMIN (GLUCOPHAGE) 1000 MG tablet Take 1,000 mg by mouth daily.     ??? metoprolol succinate (TOPROL-XL) 25 MG 24 hr tablet Take 1 tablet (25 mg total) by mouth two (2) times a day. 180 tablet 3   ??? nitroglycerin (NITROSTAT) 0.4 MG SL tablet Place 1 tablet (0.4 mg total) under the tongue every five (5) minutes as needed for chest pain. 100 tablet 0   ??? NOVOLIN N NPH U-100 INSULIN 100 unit/mL injection TAKE 10 UNIT(S) SUBCUTANEOUS EVERY DAY     ??? OMEGA-3/DHA/EPA/FISH OIL (FISH OIL-OMEGA-3 FATTY ACIDS) 300-1,000 mg capsule Take 2 g by mouth daily.     ??? SITagliptin-metformin (JANUMET XR) 100-1,000 mg TM24 Take 1 tablet by mouth daily.       No current facility-administered medications for this visit.       No Known Allergies    Patient Active Problem List   Diagnosis   ??? Nondependent alcohol abuse, continuous drinking behavior   ??? Type II diabetes mellitus (CMS-HCC)   ??? Hyperlipidemia   ??? Chest pain   ??? Essential hypertension   ??? Tobacco use disorder   ??? Angina at rest (CMS-HCC)   ??? Coronary artery disease of native artery of native heart with stable angina pectoris (CMS-HCC)   ??? Coronary artery disease involving native coronary artery of native heart   ??? Statin intolerance       Reviewed and up to date in Epic.    Appropriateness of Therapy     Acute infections noted within Epic:  No active infections  Patient reported infection: None    Is medication and dose appropriate based on diagnosis and infection status? Yes    Prescription has been clinically reviewed: Yes      Baseline Quality of Life Assessment      How many days over the past month did your CAD  keep you from your normal activities? For example, brushing your teeth or getting up in the morning. Patient declined to answer    Financial Information     Medication Assistance provided: Prior Authorization    Anticipated copay of $9.85 (  28 days) reviewed with patient. Verified delivery address.    Delivery Information     Scheduled delivery date: 12/12/20    Expected start date: 12/12/20    Medication will be delivered via Same Day Courier to the prescription address in [facility].  This shipment will not require a signature.      Explained the services we provide at [facility] and that each month we would call to set up refills.  Stressed importance of returning phone calls so that we could ensure they receive their medications in time each month.  Informed patient that we should be setting up refills 7-10 days prior to when they will run out of medication.  A pharmacist will reach out to perform a clinical assessment periodically.  Informed patient that a welcome packet, containing information about our pharmacy and other support services, a Notice of Privacy Practices, and a drug information handout will be sent.      Patient verbalized understanding of the above information as well as how to contact the pharmacy at [phone] option 4 with any questions/concerns.  The pharmacy is open Monday through Friday 8:30am-4:30pm.  A pharmacist is available 24/7 via pager to answer any clinical questions they may have.    Patient Specific Needs     - Does the patient have any physical, cognitive, or cultural barriers? No    - Patient prefers to have medications discussed with  Patient     - Is the patient or caregiver able to read and understand education materials at a high school level or above? Yes    - Patient's primary language is  English     - Is the patient high risk? No    - Does the patient require a Care Management Plan? No     - Does the patient require physician intervention or other additional services (i.e. nutrition, smoking cessation, social work)? No      Camillo Flaming  [facility] Specialty Pharmacist the patient or caregiver able to read and understand education materials at a high school level or above? Yes    - Patient's primary language is  English     - Is the patient high risk? No    - Does the patient require a Care Management Plan? {Blank single:19197::No,Yes}     - Does the patient require physician intervention or other additional services (i.e. nutrition, smoking cessation, social work)? {Blank single:19197::No,Yes - ***}      Camillo Flaming  [facility] Specialty Pharmacist

## 2020-12-11 MED ORDER — EMPTY CONTAINER
2 refills | 0 days
Start: 2020-12-11 — End: ?

## 2020-12-12 MED FILL — EMPTY CONTAINER: 120 days supply | Qty: 1 | Fill #0

## 2020-12-14 DIAGNOSIS — I25118 Atherosclerotic heart disease of native coronary artery with other forms of angina pectoris: Principal | ICD-10-CM

## 2020-12-14 DIAGNOSIS — Z789 Other specified health status: Principal | ICD-10-CM

## 2020-12-14 MED ORDER — PRALUENT PEN 75 MG/ML SUBCUTANEOUS PEN INJECTOR
SUBCUTANEOUS | 3 refills | 84 days | Status: CP
Start: 2020-12-14 — End: ?
  Filled 2021-01-11: qty 6, 84d supply, fill #0

## 2021-01-07 NOTE — Unmapped (Signed)
[facility] Clinical Assessment & Refill Coordination Note    Larry Irwin, DOB: 24-Aug-1976  Phone: [phone] (home)     All above HIPAA information was verified with patient.     Was a Nurse, learning disability used for this call? No    Specialty Medication(s):   General Specialty: Praluent     Current Outpatient Medications   Medication Sig Dispense Refill   ??? alirocumab (PRALUENT PEN) 75 mg/mL PnIj Inject the contents of 1 pen (75 mg) under the skin every fourteen (14) days. 6 mL 3   ??? ALPRAZolam (XANAX) 1 MG tablet Take 1 mg by mouth Three (3) times a day.     ??? amitriptyline (ELAVIL) 25 MG tablet Take 25 mg by mouth nightly.     ??? aspirin (ASPIR-81) 81 MG tablet Take 81 mg by mouth daily.      ??? BD INSULIN SYRINGE ULTRA-FINE 1 mL 31 gauge x 5/16 (8 mm) Syrg TAKE 1 MISCELLANEOUS THREE TIMES A DAY     ??? clopidogrel (PLAVIX) 75 mg tablet Take 75 mg by mouth daily.     ??? desvenlafaxine succinate (PRISTIQ) 100 MG 24 hr tablet Take 100 mg by mouth daily.     ??? diazePAM (VALIUM) 10 MG tablet Take 10 mg by mouth Three (3) times a day.      ??? empty container (SHARPS-A-GATOR DISPOSAL SYSTEM) Misc Use as directed for sharps disposal 1 each 2   ??? esomeprazole (NEXIUM) 20 MG capsule Take 20 mg by mouth daily.     ??? isosorbide mononitrate (IMDUR) 60 MG 24 hr tablet Take 120 mg by mouth daily.      ??? lisinopriL (PRINIVIL,ZESTRIL) 2.5 MG tablet Take 2.5 mg by mouth daily.     ??? metFORMIN (GLUCOPHAGE) 1000 MG tablet Take 1,000 mg by mouth daily.     ??? metoprolol succinate (TOPROL-XL) 25 MG 24 hr tablet Take 1 tablet (25 mg total) by mouth two (2) times a day. 180 tablet 3   ??? nitroglycerin (NITROSTAT) 0.4 MG SL tablet Place 1 tablet (0.4 mg total) under the tongue every five (5) minutes as needed for chest pain. 100 tablet 0   ??? NOVOLIN N NPH U-100 INSULIN 100 unit/mL injection TAKE 10 UNIT(S) SUBCUTANEOUS EVERY DAY     ??? OMEGA-3/DHA/EPA/FISH OIL (FISH OIL-OMEGA-3 FATTY ACIDS) 300-1,000 mg capsule Take 2 g by mouth daily.     ??? SITagliptin-metformin (JANUMET XR) 100-1,000 mg TM24 Take 1 tablet by mouth daily.       No current facility-administered medications for this visit.        Changes to medications: [name] reports no changes at this time.    No Known Allergies    Changes to allergies: No    SPECIALTY MEDICATION ADHERENCE     Praluent 75 mg/ml: 3 days of medicine on hand     Medication Adherence    Patient reported X missed doses in the last month: 0  Specialty Medication: Praluent 75 mg/mL  Informant: patient          Specialty medication(s) dose(s) confirmed: Regimen is correct and unchanged.     Are there any concerns with adherence? No    Adherence counseling provided? Not needed    CLINICAL MANAGEMENT AND INTERVENTION      Clinical Benefit Assessment:    Do you feel the medicine is effective or helping your condition? Yes    Clinical Benefit counseling provided? Not needed    Adverse Effects  Assessment:    Are you experiencing any side effects? No    Are you experiencing difficulty administering your medicine? No    Quality of Life Assessment:    How many days over the past month did your CAD  keep you from your normal activities? For example, brushing your teeth or getting up in the morning. Patient declined to answer    Have you discussed this with your provider? Not needed    Acute Infection Status:    Acute infections noted within Epic:  No active infections  Patient reported infection: None    Therapy Appropriateness:    Is therapy appropriate? Yes, therapy is appropriate and should be continued    DISEASE/MEDICATION-SPECIFIC INFORMATION      For patients on injectable medications: Patient currently has 0 doses left.  Next injection is scheduled for 01/10/21 (Will be out of town until 01/11/21 so he will plan to take dose then).    PATIENT SPECIFIC NEEDS     - Does the patient have any physical, cognitive, or cultural barriers? No    - Is the patient high risk? No    - Does the patient require a Care Management Plan? No     - Does the patient require physician intervention or other additional services (i.e. nutrition, smoking cessation, social work)? No      SHIPPING     Specialty Medication(s) to be Shipped:   General Specialty: Praluent    Other medication(s) to be shipped: No additional medications requested for fill at this time     Changes to insurance: No    Delivery Scheduled: Yes, Expected medication delivery date: 01/11/21.     Medication will be delivered via Same Day Courier to the confirmed prescription address in [facility].    The patient will receive a drug information handout for each medication shipped and additional FDA Medication Guides as required.  Verified that patient has previously received a Conservation officer, historic buildings and a Surveyor, mining.    All of the patient's questions and concerns have been addressed.    Camillo Flaming   [facility] Specialty Pharmacist

## 2021-01-09 NOTE — Unmapped (Signed)
I reached out to the patient no answer could not leave a vm mailbox was full.  I can offer the patient Financial Assistance.

## 2021-01-10 NOTE — Unmapped (Signed)
I callled pt and told him Larry Irwin attempted to call him to discuss financial assitance.  He states he was in [place] at a funeral and to please call him tomorrow after 10:00 AM.

## 2021-03-29 NOTE — Unmapped (Signed)
The [facility] has made a second and final attempt to reach this patient to refill the following medication:Praluent.      We have been unable to leave messages on the following phone numbers: [phone].    Dates contacted: 03/22/21, 03/29/21  Last scheduled delivery: 01/11/21    The patient may be at risk of non-compliance with this medication. The patient should call the [facility] at [phone] (option 4) to refill medication.    Barb Shear Celedonio Savage   [facility]

## 2021-06-10 NOTE — Unmapped (Signed)
The [facility] has made a third and final attempt to reach this patient to refill the following medication: Praluent 75mg /ml.      We have left voicemails on the following phone numbers: [phone].    Dates contacted: 7/15, 7/22, 05/21/21  Last scheduled delivery: 01/11/21 (84 days)    The patient may be at risk of non-compliance with this medication. The patient should call the [facility] at [phone]  Option 4, then Option 2 (all other specialty patients) to refill medication.    Camillo Flaming   [facility] Specialty Pharmacist

## 2021-06-11 NOTE — Unmapped (Signed)
Attempted to call pt concerning his Repatha/Praluent.  Unbale to lm and mail box was full.

## 2021-06-17 NOTE — Unmapped (Signed)
After numerous attempts to contact pt with no avail, I called his contact person.  His room mate said he was incarcerated at this time, therefore I will close the message.

## 2021-06-21 NOTE — Unmapped (Signed)
Specialty Medication(s): Praluent    Mr.Gheen has been dis-enrolled from the [facility] specialty pharmacy services due to multiple unsuccessful outreach attempts by the pharmacy.    Additional information provided to the patient: n/a    Camillo Flaming  [facility] Specialty Pharmacist
